# Patient Record
Sex: Female | Born: 1950 | Race: White | Hispanic: No | Marital: Married | Smoking: Never smoker
Health system: Southern US, Community
[De-identification: ages and names within clinical notes are randomized; demographics above are authoritative.]

## PROBLEM LIST (undated history)

## (undated) DIAGNOSIS — E079 Disorder of thyroid, unspecified: Secondary | ICD-10-CM

## (undated) DIAGNOSIS — I4719 Other supraventricular tachycardia: Secondary | ICD-10-CM

## (undated) DIAGNOSIS — Z9889 Other specified postprocedural states: Secondary | ICD-10-CM

## (undated) DIAGNOSIS — E785 Hyperlipidemia, unspecified: Secondary | ICD-10-CM

## (undated) DIAGNOSIS — M810 Age-related osteoporosis without current pathological fracture: Secondary | ICD-10-CM

## (undated) DIAGNOSIS — I471 Supraventricular tachycardia: Secondary | ICD-10-CM

## (undated) DIAGNOSIS — K219 Gastro-esophageal reflux disease without esophagitis: Secondary | ICD-10-CM

## (undated) DIAGNOSIS — R112 Nausea with vomiting, unspecified: Secondary | ICD-10-CM

## (undated) DIAGNOSIS — N390 Urinary tract infection, site not specified: Secondary | ICD-10-CM

## (undated) DIAGNOSIS — C801 Malignant (primary) neoplasm, unspecified: Secondary | ICD-10-CM

## (undated) HISTORY — PX: BREAST SURGERY: SHX581

## (undated) HISTORY — PX: LAPAROSCOPIC ASSISTED VAGINAL HYSTERECTOMY: SHX5398

---

## 2010-06-30 ENCOUNTER — Emergency Department (HOSPITAL_BASED_OUTPATIENT_CLINIC_OR_DEPARTMENT_OTHER): Admission: EM | Admit: 2010-06-30 | Discharge: 2010-06-30 | Payer: Self-pay | Admitting: Emergency Medicine

## 2010-09-19 DIAGNOSIS — C801 Malignant (primary) neoplasm, unspecified: Secondary | ICD-10-CM

## 2010-09-19 HISTORY — DX: Malignant (primary) neoplasm, unspecified: C80.1

## 2011-04-20 HISTORY — PX: BREAST SURGERY: SHX581

## 2011-10-24 ENCOUNTER — Emergency Department (HOSPITAL_BASED_OUTPATIENT_CLINIC_OR_DEPARTMENT_OTHER)
Admission: EM | Admit: 2011-10-24 | Discharge: 2011-10-24 | Disposition: A | Discharge status: Home / Self Care | Payer: BC Managed Care – PPO | Attending: Emergency Medicine | Admitting: Emergency Medicine

## 2011-10-24 ENCOUNTER — Encounter (HOSPITAL_BASED_OUTPATIENT_CLINIC_OR_DEPARTMENT_OTHER): Payer: Self-pay | Admitting: *Deleted

## 2011-10-24 DIAGNOSIS — T7840XA Allergy, unspecified, initial encounter: Secondary | ICD-10-CM

## 2011-10-24 DIAGNOSIS — L272 Dermatitis due to ingested food: Secondary | ICD-10-CM | POA: Insufficient documentation

## 2011-10-24 DIAGNOSIS — R0602 Shortness of breath: Secondary | ICD-10-CM | POA: Insufficient documentation

## 2011-10-24 DIAGNOSIS — E079 Disorder of thyroid, unspecified: Secondary | ICD-10-CM | POA: Insufficient documentation

## 2011-10-24 DIAGNOSIS — R21 Rash and other nonspecific skin eruption: Secondary | ICD-10-CM | POA: Insufficient documentation

## 2011-10-24 HISTORY — DX: Malignant (primary) neoplasm, unspecified: C80.1

## 2011-10-24 HISTORY — DX: Supraventricular tachycardia: I47.1

## 2011-10-24 HISTORY — DX: Disorder of thyroid, unspecified: E07.9

## 2011-10-24 HISTORY — DX: Other supraventricular tachycardia: I47.19

## 2011-10-24 MED ORDER — EPINEPHRINE 0.3 MG/0.3ML IJ DEVI: 0.3000 mg | INTRAMUSCULAR | Status: DC | PRN

## 2011-10-24 MED ORDER — METHYLPREDNISOLONE SODIUM SUCC 125 MG IJ SOLR
125.0000 mg | Freq: Once | INTRAMUSCULAR | Status: AC
  Administered 2011-10-24: 125 mg via INTRAVENOUS
  Filled 2011-10-24: qty 2

## 2011-10-24 MED ORDER — DIPHENHYDRAMINE HCL 50 MG/ML IJ SOLN
50.0000 mg | Freq: Once | INTRAMUSCULAR | Status: AC
  Administered 2011-10-24: 50 mg via INTRAVENOUS
  Filled 2011-10-24: qty 1

## 2011-10-24 MED ORDER — DIPHENHYDRAMINE HCL 25 MG PO TABS: 25.0000 mg | ORAL_TABLET | Freq: Four times a day (QID) | ORAL | Status: DC

## 2011-10-24 MED ORDER — PREDNISONE 10 MG PO TABS: 40.0000 mg | ORAL_TABLET | Freq: Every day | ORAL | Status: DC

## 2011-10-24 MED ORDER — FAMOTIDINE IN NACL 20-0.9 MG/50ML-% IV SOLN
20.0000 mg | Freq: Once | INTRAVENOUS | Status: AC
  Administered 2011-10-24: 20 mg via INTRAVENOUS
  Filled 2011-10-24: qty 50

## 2011-10-24 MED ORDER — FAMOTIDINE 20 MG PO TABS: 20.0000 mg | ORAL_TABLET | Freq: Two times a day (BID) | ORAL | Status: DC

## 2011-10-24 NOTE — ED Notes (Signed)
Redness and splotchy skin no longer present, denies itching, skin returned to normal coloration.

## 2011-10-24 NOTE — ED Provider Notes (Signed)
History     CSN: [id number]  Arrival date & time 10/24/11  1422   First MD Initiated Contact with Patient 10/24/11 1446      Chief Complaint  Patient presents with  . Allergic Reaction    (Consider location/radiation/quality/duration/timing/severity/associated sxs/prior treatment) Patient is a 61 y.o. female presenting with allergic reaction. The history is provided by the patient.  Allergic Reaction The primary symptoms are  shortness of breath and rash. The primary symptoms do not include wheezing, cough, abdominal pain, nausea, dizziness, altered mental status or angioedema. Primary symptoms comment: positive heart racing The current episode started less than 1 hour ago. The problem has been gradually worsening. This is a new problem.  The rash is not associated with itching.  The onset of the reaction was associated with eating. Significant symptoms also include eye redness and flushing. Significant symptoms that are not present include rhinorrhea or itching.  Pt had lunch including a trio of fish (calamari, salmon, tuna). Symptoms began approx 15 minutes after eating.  Hx PAT, reports it feels nothing like this.  Hx BrCa, bilateral mastectomy in August of last year with no chemo currently- only taking tamoxifen. No new medications.  Past Medical History  Diagnosis Date  . PAT (paroxysmal atrial tachycardia)   . Thyroid disease   . Cancer     Past Surgical History  Procedure Date  . Breast surgery     No family history on file.  History  Substance Use Topics  . Smoking status: Never Smoker   . Smokeless tobacco: Not on file  . Alcohol Use: Yes     Review of Systems  Constitutional: Negative for fever and chills.  HENT: Negative for facial swelling, rhinorrhea, trouble swallowing, neck pain, neck stiffness and voice change.   Eyes: Positive for redness. Negative for visual disturbance.  Respiratory: Positive for shortness of breath. Negative for cough, chest  tightness and wheezing.   Cardiovascular: Negative for chest pain.  Gastrointestinal: Negative for nausea and abdominal pain.  Skin: Positive for flushing and rash. Negative for itching.  Neurological: Negative for dizziness, syncope, speech difficulty and weakness.  Psychiatric/Behavioral: Negative for confusion and altered mental status.  All other systems reviewed and are negative.    Allergies  Review of patient's allergies indicates no known allergies.  Home Medications   Current Outpatient Rx  Name Route Sig Dispense Refill  . SYNTHROID PO Oral Take by mouth.    . TAMOXIFEN CITRATE PO Oral Take by mouth.      BP 139/63  Pulse 107  Temp(Src) 97.8 F (36.6 C) (Oral)  Resp 26  SpO2 100%  Physical Exam  Nursing note and vitals reviewed. Constitutional: She is oriented to person, place, and time. She appears well-developed and well-nourished. She appears distressed.  HENT:  Head: Normocephalic and atraumatic.  Right Ear: External ear normal.  Left Ear: External ear normal.  Nose: Nose normal.  Mouth/Throat: Uvula is midline, oropharynx is clear and moist and mucous membranes are normal. No uvula swelling.  Eyes: Pupils are equal, round, and reactive to light. Right conjunctiva is injected. Left conjunctiva is injected.  Neck: Normal range of motion. Neck supple. No JVD present.  Cardiovascular: Regular rhythm, normal heart sounds and normal pulses.  Tachycardia present.   Pulmonary/Chest: Effort normal and breath sounds normal. No respiratory distress. She has no wheezes. She exhibits no tenderness.  Abdominal: Soft. Bowel sounds are normal. She exhibits no distension. There is no tenderness.  Musculoskeletal: She exhibits no edema  and no tenderness.  Neurological: She is alert and oriented to person, place, and time. No cranial nerve deficit.  Skin: Skin is warm and dry. Rash noted.       Raised, erythematous rash to entire face, upper chest (stops at superior border of  breasts) and bilateral shoulders/upper arms  Psychiatric:       Anxious    ED Course  Procedures (including critical care time)  Labs Reviewed - No data to display No results found.   Dx 1: Allergic reaction to food   MDM  3:00 Pt has been seen and evaluated. Initial H&P completed. Allergic reaction, likely to seafood. Airway intact, no wheezes, no throat swelling or tightness. No epi necessary. Solu-medrol, benadryl, pepcid ordered.   4:00 Pt with complete resolution of symptoms after medications x1. Rash resolved, conjunctival injection resolved, tachycardia improved with rate hovering around 90bpm, denies sensation of dyspnea. Will continue to monitor with disposition to be set by [facility], with whom this patient has been discussed.        Shaaron Adler, [place] 10/24/11 1616

## 2011-10-24 NOTE — ED Provider Notes (Signed)
Medical screening examination/treatment/procedure(s) were conducted as a shared visit with non-physician practitioner(s) and myself.  I personally evaluated the patient during the encounter  Hives head/neck/chest on arrival. Airway intact. Pepcid, benadryl, steroids given. Patient appears to be improved on repeat exam. Monitor, home with same as above. Pt signed out to Dr. Oletta Lamas, Langston Masker, PA  Forbes Cellar, MD 10/24/11 [phone]

## 2011-10-24 NOTE — ED Notes (Signed)
Ate seafood and afterward her skin got red, her heart is beating fast,

## 2013-02-23 DIAGNOSIS — M19049 Primary osteoarthritis, unspecified hand: Secondary | ICD-10-CM | POA: Insufficient documentation

## 2013-05-23 ENCOUNTER — Emergency Department (HOSPITAL_BASED_OUTPATIENT_CLINIC_OR_DEPARTMENT_OTHER): Payer: BC Managed Care – PPO

## 2013-05-23 ENCOUNTER — Emergency Department (HOSPITAL_BASED_OUTPATIENT_CLINIC_OR_DEPARTMENT_OTHER)
Admission: EM | Admit: 2013-05-23 | Discharge: 2013-05-23 | Disposition: A | Discharge status: Home / Self Care | Payer: BC Managed Care – PPO | Attending: Emergency Medicine | Admitting: Emergency Medicine

## 2013-05-23 ENCOUNTER — Encounter (HOSPITAL_BASED_OUTPATIENT_CLINIC_OR_DEPARTMENT_OTHER): Payer: Self-pay | Admitting: Emergency Medicine

## 2013-05-23 DIAGNOSIS — Z8679 Personal history of other diseases of the circulatory system: Secondary | ICD-10-CM | POA: Insufficient documentation

## 2013-05-23 DIAGNOSIS — W010XXA Fall on same level from slipping, tripping and stumbling without subsequent striking against object, initial encounter: Secondary | ICD-10-CM | POA: Insufficient documentation

## 2013-05-23 DIAGNOSIS — M810 Age-related osteoporosis without current pathological fracture: Secondary | ICD-10-CM | POA: Insufficient documentation

## 2013-05-23 DIAGNOSIS — Z859 Personal history of malignant neoplasm, unspecified: Secondary | ICD-10-CM | POA: Insufficient documentation

## 2013-05-23 DIAGNOSIS — S8000XA Contusion of unspecified knee, initial encounter: Secondary | ICD-10-CM | POA: Insufficient documentation

## 2013-05-23 DIAGNOSIS — S8002XA Contusion of left knee, initial encounter: Secondary | ICD-10-CM

## 2013-05-23 DIAGNOSIS — Z79899 Other long term (current) drug therapy: Secondary | ICD-10-CM | POA: Insufficient documentation

## 2013-05-23 DIAGNOSIS — M25462 Effusion, left knee: Secondary | ICD-10-CM

## 2013-05-23 DIAGNOSIS — Y929 Unspecified place or not applicable: Secondary | ICD-10-CM | POA: Insufficient documentation

## 2013-05-23 DIAGNOSIS — Y939 Activity, unspecified: Secondary | ICD-10-CM | POA: Insufficient documentation

## 2013-05-23 DIAGNOSIS — E079 Disorder of thyroid, unspecified: Secondary | ICD-10-CM | POA: Insufficient documentation

## 2013-05-23 HISTORY — DX: Age-related osteoporosis without current pathological fracture: M81.0

## 2013-05-23 MED ORDER — IBUPROFEN 800 MG PO TABS: 800.0000 mg | ORAL_TABLET | Freq: Three times a day (TID) | ORAL | Status: DC

## 2013-05-23 MED ORDER — OXYCODONE-ACETAMINOPHEN 5-325 MG PO TABS: 2.0000 | ORAL_TABLET | ORAL | Status: DC | PRN

## 2013-05-23 NOTE — ED Notes (Signed)
Patient instructed on proper use of crutches, patient understands & provided return demonstration.

## 2013-05-23 NOTE — ED Notes (Signed)
Slipped on wet pavement onto left knee.  Pain and swelling now.  Skin intact.

## 2013-05-23 NOTE — ED Provider Notes (Signed)
CSN: [id number]     Arrival date & time 05/23/13  1548 History   First MD Initiated Contact with Patient 05/23/13 1559     Chief Complaint  Patient presents with  . Knee Injury   (Consider location/radiation/quality/duration/timing/severity/associated sxs/prior Treatment) HPI Comments: Patient presents with left knee pain. She states she slipped on some wet concrete and fell straight down onto the left knee. She has constant throbbing pain with associated swelling to the knee. She denies any other injuries from the fall. She has not taken anything today for the pain.   Past Medical History  Diagnosis Date  . PAT (paroxysmal atrial tachycardia)   . Thyroid disease   . Cancer   . Osteoporosis    Past Surgical History  Procedure Laterality Date  . Breast surgery     No family history on file. History  Substance Use Topics  . Smoking status: Never Smoker   . Smokeless tobacco: Not on file  . Alcohol Use: Yes   OB History   Grav Para Term Preterm Abortions TAB SAB Ect Mult Living                 Review of Systems  Constitutional: Negative for fever.  HENT: Negative for neck pain.   Gastrointestinal: Negative for nausea and vomiting.  Musculoskeletal: Positive for joint swelling and arthralgias. Negative for back pain.  Skin: Negative for wound.  Neurological: Negative for weakness, numbness and headaches.    Allergies  Review of patient's allergies indicates no known allergies.  Home Medications   Current Outpatient Rx  Name  Route  Sig  Dispense  Refill  . denosumab (PROLIA) 60 MG/ML SOLN injection   Subcutaneous   Inject 60 mg into the skin every 6 (six) months. Administer in upper arm, thigh, or abdomen         . exemestane (AROMASIN) 25 MG tablet   Oral   Take 25 mg by mouth daily after breakfast.         . pantoprazole (PROTONIX) 40 MG tablet   Oral   Take 40 mg by mouth daily.         Marland Kitchen EXPIRED: diphenhydrAMINE (BENADRYL) 25 MG tablet   Oral  Take 1 tablet (25 mg total) by mouth every 6 (six) hours.   20 tablet   0   . EPINEPHrine (EPIPEN) 0.3 mg/0.3 mL DEVI   Intramuscular   Inject 0.3 mLs (0.3 mg total) into the muscle as needed.   1 Device   0   . EXPIRED: famotidine (PEPCID) 20 MG tablet   Oral   Take 1 tablet (20 mg total) by mouth 2 (two) times daily.   10 tablet   0   . ibuprofen (ADVIL,MOTRIN) 800 MG tablet   Oral   Take 1 tablet (800 mg total) by mouth 3 (three) times daily.   21 tablet   0   . Levothyroxine Sodium (SYNTHROID PO)   Oral   Take by mouth.         . oxyCODONE-acetaminophen (PERCOCET) 5-325 MG per tablet   Oral   Take 2 tablets by mouth every 4 (four) hours as needed for pain.   20 tablet   0   . predniSONE (DELTASONE) 10 MG tablet   Oral   Take 4 tablets (40 mg total) by mouth daily.   16 tablet   0   . TAMOXIFEN CITRATE PO   Oral   Take by mouth.  BP 142/79  Pulse 82  Temp(Src) 99.1 F (37.3 C) (Oral)  Resp 16  Ht 5\' 7"  (1.702 m)  Wt 130 lb (58.968 kg)  BMI 20.36 kg/m2  SpO2 99% Physical Exam  Constitutional: She is oriented to person, place, and time. She appears well-developed and well-nourished.  HENT:  Head: Normocephalic and atraumatic.  Neck: Normal range of motion. Neck supple.  Cardiovascular: Normal rate.   Pulmonary/Chest: Effort normal.  Musculoskeletal: She exhibits edema and tenderness.  +TTP anterior left knee.  +effusion/swelling noted.  No pain to ankle/knee.  Ligament exam inadequate due to pt discomfort, but no gross instability. Pt is able to due SLR.  NV intact distally.  No overlying wounds  Neurological: She is alert and oriented to person, place, and time.  Skin: Skin is warm and dry.  Psychiatric: She has a normal mood and affect.    ED Course  Procedures (including critical care time) Labs Review Labs Reviewed - No data to display Imaging Review Dg Knee Complete 4 Views Left  05/23/2013   *RADIOLOGY REPORT*  Clinical Data:  Left knee injury  LEFT KNEE - COMPLETE 4+ VIEW  Comparison: None.  Findings: Four views of the right knee demonstrate a moderate suprapatellar joint effusion.  Mild soft tissue swelling in the prepatellar space.  No acute fracture or malalignment is identified.  Bony mineralization is within normal limits.  No significant degenerative changes.  IMPRESSION: Prepatellar soft tissue swelling and moderate suprapatellar knee joint effusion without evidence of acute fracture or malalignment.   Original Report Authenticated By: Malachy Moan, M.D.    MDM   1. Knee contusion, left, initial encounter   2. Knee effusion, left    Pt was advised in RICE.  Was placed in a knee immobilizer and given crutches. She was given prescription for ibuprofen 800 mg tablets and Percocet. She was given a referral to followup with Dr. Pearletha Forge.    Rolan Bucco, MD 05/23/13 [phone]

## 2014-04-21 DIAGNOSIS — E039 Hypothyroidism, unspecified: Secondary | ICD-10-CM | POA: Insufficient documentation

## 2014-04-21 DIAGNOSIS — Z170 Estrogen receptor positive status [ER+]: Secondary | ICD-10-CM | POA: Insufficient documentation

## 2014-04-21 DIAGNOSIS — M810 Age-related osteoporosis without current pathological fracture: Secondary | ICD-10-CM | POA: Insufficient documentation

## 2014-04-21 DIAGNOSIS — C50412 Malignant neoplasm of upper-outer quadrant of left female breast: Secondary | ICD-10-CM | POA: Insufficient documentation

## 2014-04-21 DIAGNOSIS — K219 Gastro-esophageal reflux disease without esophagitis: Secondary | ICD-10-CM | POA: Insufficient documentation

## 2014-04-21 HISTORY — DX: Hypothyroidism, unspecified: E03.9

## 2014-06-27 DIAGNOSIS — N3020 Other chronic cystitis without hematuria: Secondary | ICD-10-CM | POA: Insufficient documentation

## 2014-06-27 DIAGNOSIS — H93299 Other abnormal auditory perceptions, unspecified ear: Secondary | ICD-10-CM | POA: Insufficient documentation

## 2014-06-27 DIAGNOSIS — E785 Hyperlipidemia, unspecified: Secondary | ICD-10-CM | POA: Insufficient documentation

## 2014-06-27 DIAGNOSIS — E559 Vitamin D deficiency, unspecified: Secondary | ICD-10-CM | POA: Insufficient documentation

## 2014-06-27 DIAGNOSIS — N952 Postmenopausal atrophic vaginitis: Secondary | ICD-10-CM | POA: Insufficient documentation

## 2014-06-27 DIAGNOSIS — I491 Atrial premature depolarization: Secondary | ICD-10-CM | POA: Insufficient documentation

## 2015-06-09 DIAGNOSIS — Z08 Encounter for follow-up examination after completed treatment for malignant neoplasm: Secondary | ICD-10-CM | POA: Insufficient documentation

## 2015-06-09 DIAGNOSIS — Z853 Personal history of malignant neoplasm of breast: Secondary | ICD-10-CM | POA: Insufficient documentation

## 2015-08-18 DIAGNOSIS — R002 Palpitations: Secondary | ICD-10-CM | POA: Insufficient documentation

## 2015-12-16 DIAGNOSIS — Z79811 Long term (current) use of aromatase inhibitors: Secondary | ICD-10-CM | POA: Insufficient documentation

## 2015-12-16 DIAGNOSIS — Z5181 Encounter for therapeutic drug level monitoring: Secondary | ICD-10-CM | POA: Insufficient documentation

## 2017-08-31 DIAGNOSIS — N390 Urinary tract infection, site not specified: Secondary | ICD-10-CM | POA: Insufficient documentation

## 2017-10-03 DIAGNOSIS — H6123 Impacted cerumen, bilateral: Secondary | ICD-10-CM | POA: Insufficient documentation

## 2017-11-02 DIAGNOSIS — Z5181 Encounter for therapeutic drug level monitoring: Secondary | ICD-10-CM | POA: Insufficient documentation

## 2017-11-02 DIAGNOSIS — Z79899 Other long term (current) drug therapy: Secondary | ICD-10-CM | POA: Insufficient documentation

## 2019-10-27 ENCOUNTER — Ambulatory Visit: Payer: Medicare Other | Attending: Internal Medicine

## 2019-10-27 DIAGNOSIS — Z23 Encounter for immunization: Secondary | ICD-10-CM

## 2019-10-27 NOTE — Progress Notes (Signed)
   Covid-19 Vaccination Clinic  Name:  Robin York    MRN: [id number] DOB: 11/25/1950  10/27/2019  [name] was observed post Covid-19 immunization for 15 minutes without incidence. She was provided with Vaccine Information Sheet and instruction to access the V-Safe system.   [name] was instructed to call 911 with any severe reactions post vaccine: Marland Kitchen Difficulty breathing  . Swelling of your face and throat  . A fast heartbeat  . A bad rash all over your body  . Dizziness and weakness    Immunizations Administered    Name Date Dose VIS Date Route   Pfizer COVID-19 Vaccine 10/27/2019  2:51 PM 0.3 mL 08/30/2019 Intramuscular   Manufacturer: [place]   Lot: [id number]   [place]: [id number]

## 2019-11-11 ENCOUNTER — Ambulatory Visit: Payer: Self-pay

## 2019-11-21 ENCOUNTER — Ambulatory Visit: Payer: Medicare Other | Attending: Internal Medicine

## 2019-11-21 DIAGNOSIS — Z23 Encounter for immunization: Secondary | ICD-10-CM | POA: Insufficient documentation

## 2019-11-21 NOTE — Progress Notes (Signed)
   Covid-19 Vaccination Clinic  Name:  Aris Strelow    MRN: [id number] DOB: [birth year]  11/21/2019  Ms. Mcenery was observed post Covid-19 immunization for 15 minutes without incident. She was provided with Vaccine Information Sheet and instruction to access the V-Safe system.   Ms. Urzua was instructed to call 911 with any severe reactions post vaccine: Marland Kitchen Difficulty breathing  . Swelling of face and throat  . A fast heartbeat  . A bad rash all over body  . Dizziness and weakness   Immunizations Administered    Name Date Dose VIS Date Route   Pfizer COVID-19 Vaccine 11/21/2019  9:54 AM 0.3 mL 08/30/2019 Intramuscular   Manufacturer: [employer]   Lot: [id number]   [place]: [id number]

## 2020-07-20 HISTORY — PX: FINGER SURGERY: SHX640

## 2020-07-20 HISTORY — PX: COLONOSCOPY: SHX174

## 2021-03-18 DIAGNOSIS — R419 Unspecified symptoms and signs involving cognitive functions and awareness: Secondary | ICD-10-CM | POA: Insufficient documentation

## 2021-03-25 ENCOUNTER — Encounter: Payer: Self-pay | Admitting: Podiatry

## 2021-03-25 ENCOUNTER — Ambulatory Visit (INDEPENDENT_AMBULATORY_CARE_PROVIDER_SITE_OTHER): Payer: Medicare Other

## 2021-03-25 ENCOUNTER — Other Ambulatory Visit: Payer: Self-pay

## 2021-03-25 ENCOUNTER — Ambulatory Visit: Payer: Medicare Other | Admitting: Podiatry

## 2021-03-25 DIAGNOSIS — M2011 Hallux valgus (acquired), right foot: Secondary | ICD-10-CM

## 2021-03-25 DIAGNOSIS — M2010 Hallux valgus (acquired), unspecified foot: Secondary | ICD-10-CM

## 2021-03-25 DIAGNOSIS — Q66219 Congenital metatarsus primus varus, unspecified foot: Secondary | ICD-10-CM

## 2021-03-25 DIAGNOSIS — M2042 Other hammer toe(s) (acquired), left foot: Secondary | ICD-10-CM

## 2021-03-25 DIAGNOSIS — M2012 Hallux valgus (acquired), left foot: Secondary | ICD-10-CM

## 2021-03-25 DIAGNOSIS — D2371 Other benign neoplasm of skin of right lower limb, including hip: Secondary | ICD-10-CM

## 2021-03-25 DIAGNOSIS — M2041 Other hammer toe(s) (acquired), right foot: Secondary | ICD-10-CM | POA: Diagnosis not present

## 2021-03-28 NOTE — Progress Notes (Signed)
  Subjective:  Patient ID: Robin York, female    DOB: June 16, 1951,  MRN: [id number]  Chief Complaint  Patient presents with   Foot Pain    1st MPJ bilateral and 2nd toe right - bunion and hammertoe deformities x years, aching more, rubbing other toes and shoes causing calluses/corns, using bandaids to cushion, very active lifestyle    New Patient (Initial Visit)    70 y.o. female presents with the above complaint. History confirmed with patient.  She is referred to me by Dr. Milinda Pointer for surgical consultation.  She is previously seen someone for evaluation for bunion surgery but was told it could be a very difficult recovery and should wait until its very painful for her.  Been getting progressively worse.  She does have a history of vitamin D deficiency and osteoporosis and is on supplementation for this.  Objective:  Physical Exam: warm, good capillary refill, no trophic changes or ulcerative lesions, normal DP and PT pulses, and normal sensory exam.  Bilateral she has hallux valgus and second hammertoe deformity, the right foot is worse, negative grind test.  Good range of motion.  Valgus rotation of the first ray bilaterally.  She has callus formation on the medial second toe   Radiographs: Multiple views x-ray of both feet: Bilateral hallux valgus deformity with met primus varus and second hammertoe deformity Assessment:   1. Acquired hallux valgus, unspecified laterality   2. Hammer toes of both feet   3. Benign neoplasm of skin of right foot   4. Metatarsus primus varus      Plan:  Patient was evaluated and treated and all questions answered.  Discussed the etiology and treatment including surgical and non surgical treatment for painful bunions and hammertoes.  She has exhausted all non surgical treatment prior to this visit including shoe gear changes and padding.  She desires surgical intervention. We discussed all risks including but not limited to: pain, swelling, infection,  scar, numbness which may be temporary or permanent, chronic pain, stiffness, nerve pain or damage, wound healing problems, bone healing problems including delayed or non-union and recurrence. Specifically we discussed the following procedures: Lapidus bunionectomy with autograft from ipsilateral calcaneus and second hammertoe correction.  She would like to plan for this in the fall.  She will return to see me in September for presurgical planning visit.  All questions were addressed.  No follow-ups on file.

## 2021-05-20 ENCOUNTER — Other Ambulatory Visit: Payer: Self-pay

## 2021-05-20 ENCOUNTER — Ambulatory Visit (INDEPENDENT_AMBULATORY_CARE_PROVIDER_SITE_OTHER): Payer: Medicare Other

## 2021-05-20 ENCOUNTER — Ambulatory Visit: Payer: Medicare Other | Admitting: Podiatry

## 2021-05-20 DIAGNOSIS — M21611 Bunion of right foot: Secondary | ICD-10-CM

## 2021-05-20 DIAGNOSIS — M21612 Bunion of left foot: Secondary | ICD-10-CM

## 2021-05-25 NOTE — Progress Notes (Signed)
  Subjective:  Patient ID: Robin York, female    DOB: Aug 12, 1951,  MRN: [id number]  Chief Complaint  Patient presents with   Bunions    2 month follow up    70 y.o. female presents with the above complaint. History confirmed with patient.  She is here for follow-up on bilateral bunions she is here with her husband today  Objective:  Physical Exam: warm, good capillary refill, no trophic changes or ulcerative lesions, normal DP and PT pulses, and normal sensory exam.  Bilateral she has hallux valgus and second hammertoe deformity, the right foot is worse, negative grind test.  Good range of motion.  Valgus rotation of the first ray bilaterally.  She has callus formation on the medial second toe   Radiographs: Multiple views x-ray of both feet: Bilateral hallux valgus deformity with met primus varus and second hammertoe deformity, sesamoid axial views taken today show subluxation of the complex as well Assessment:   1. Bilateral bunions      Plan:  Patient was evaluated and treated and all questions answered.  We again reviewed the radiographs in detail discussed the procedure itself and the postoperative recovery process.  We will plan for surgery on November 11.  She will return prior to surgery for her preoperative planning visit and will sign informed consent and dispense her surgical boot at that time.  Plan will be for Lapiplasty with autograft from the heel and possible Akin if necessary with possible Weil osteotomy and hammertoe correction of the second toe, right foot will be first  Return in about 8 weeks (around 07/15/2021) for pre surgery planning visit R foot for bunion and hammertoe.

## 2021-07-15 ENCOUNTER — Emergency Department (HOSPITAL_BASED_OUTPATIENT_CLINIC_OR_DEPARTMENT_OTHER): Payer: Medicare Other

## 2021-07-15 ENCOUNTER — Ambulatory Visit: Payer: Medicare Other | Admitting: Podiatry

## 2021-07-15 ENCOUNTER — Encounter (HOSPITAL_BASED_OUTPATIENT_CLINIC_OR_DEPARTMENT_OTHER): Payer: Self-pay | Admitting: *Deleted

## 2021-07-15 ENCOUNTER — Emergency Department (HOSPITAL_BASED_OUTPATIENT_CLINIC_OR_DEPARTMENT_OTHER)
Admission: EM | Admit: 2021-07-15 | Discharge: 2021-07-15 | Disposition: A | Discharge status: Home / Self Care | Payer: Medicare Other | Attending: Emergency Medicine | Admitting: Emergency Medicine

## 2021-07-15 ENCOUNTER — Other Ambulatory Visit: Payer: Self-pay

## 2021-07-15 DIAGNOSIS — S62109A Fracture of unspecified carpal bone, unspecified wrist, initial encounter for closed fracture: Secondary | ICD-10-CM

## 2021-07-15 DIAGNOSIS — W010XXA Fall on same level from slipping, tripping and stumbling without subsequent striking against object, initial encounter: Secondary | ICD-10-CM | POA: Diagnosis not present

## 2021-07-15 DIAGNOSIS — Y92002 Bathroom of unspecified non-institutional (private) residence single-family (private) house as the place of occurrence of the external cause: Secondary | ICD-10-CM | POA: Diagnosis not present

## 2021-07-15 DIAGNOSIS — E039 Hypothyroidism, unspecified: Secondary | ICD-10-CM | POA: Insufficient documentation

## 2021-07-15 DIAGNOSIS — S52502A Unspecified fracture of the lower end of left radius, initial encounter for closed fracture: Secondary | ICD-10-CM | POA: Diagnosis not present

## 2021-07-15 DIAGNOSIS — S6992XA Unspecified injury of left wrist, hand and finger(s), initial encounter: Secondary | ICD-10-CM | POA: Diagnosis present

## 2021-07-15 DIAGNOSIS — Z853 Personal history of malignant neoplasm of breast: Secondary | ICD-10-CM | POA: Diagnosis not present

## 2021-07-15 DIAGNOSIS — S52572A Other intraarticular fracture of lower end of left radius, initial encounter for closed fracture: Secondary | ICD-10-CM

## 2021-07-15 MED ORDER — MORPHINE SULFATE (PF) 4 MG/ML IV SOLN
4.0000 mg | Freq: Once | INTRAVENOUS | Status: AC
  Administered 2021-07-15: 4 mg via INTRAVENOUS
  Filled 2021-07-15: qty 1

## 2021-07-15 MED ORDER — ONDANSETRON HCL 4 MG PO TABS: 4.0000 mg | ORAL_TABLET | Freq: Three times a day (TID) | ORAL | 0 refills | Status: DC | PRN

## 2021-07-15 MED ORDER — ONDANSETRON HCL 4 MG/2ML IJ SOLN
4.0000 mg | Freq: Once | INTRAMUSCULAR | Status: AC
  Administered 2021-07-15: 4 mg via INTRAVENOUS
  Filled 2021-07-15: qty 2

## 2021-07-15 MED ORDER — OXYCODONE-ACETAMINOPHEN 5-325 MG PO TABS: 1.0000 | ORAL_TABLET | Freq: Four times a day (QID) | ORAL | 0 refills | Status: DC | PRN

## 2021-07-15 NOTE — ED Triage Notes (Signed)
She slipped and fell in her bathroom injury to her left wrist with deformity. To ER via EMS. She was given Fentanyl 50 mcg IV.

## 2021-07-15 NOTE — Discharge Instructions (Signed)
You have a distal radius fracture.  Please keep splint in place and please call hand surgeon office tomorrow.  He plans to see you tomorrow or Monday  Please keep ice on it.  Take Tylenol for pain and take Percocet for severe pain  Return to ER if you have worse wrist pain, fingers turning blue,

## 2021-07-15 NOTE — ED Notes (Addendum)
ED Provider at bedside.  MD applied traction to facilitate alignment of fracture

## 2021-07-15 NOTE — ED Provider Notes (Signed)
[place] EMERGENCY DEPARTMENT Provider Note   CSN: [id number] Arrival date & time: 07/15/21  1737     History Chief Complaint  Patient presents with   Fall   Wrist Injury    Robin York is a 70 y.o. female hx of UTI, here with fall and left wrist pain. Patient states that she slipped and fell and landed on her left wrist. Denies other injuries. Patient is not on a blood thinner. No meds prior to arrival.   The history is provided by the patient.      Past Medical History:  Diagnosis Date   Cancer [facility])    Osteoporosis    PAT (paroxysmal atrial tachycardia) ([place])    Thyroid disease     Patient Active Problem List   Diagnosis Date Noted   Cognitive complaints with normal neuropsychological exam 03/18/2021   Encounter for monitoring denosumab therapy 11/02/2017   Bilateral impacted cerumen 10/03/2017   Recurrent UTI 08/31/2017   Encounter for monitoring aromatase inhibitor therapy 12/16/2015   Palpitation 08/18/2015   Encounter for follow-up surveillance of breast cancer 06/09/2015   Abnormal auditory perception 06/27/2014   Atrial premature depolarization 06/27/2014   Atrophic vaginitis 06/27/2014   Chronic cystitis 06/27/2014   Hyperlipemia 06/27/2014   Vitamin D deficiency 06/27/2014   Age-related osteoporosis without current pathological fracture 04/21/2014   GERD (gastroesophageal reflux disease) 04/21/2014   Hypothyroidism 04/21/2014   Malignant neoplasm of upper-outer quadrant of left breast in female, estrogen receptor positive (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     No family history on file.  Social History   Tobacco Use   Smoking status: Never   Smokeless tobacco: Never  Vaping Use   Vaping Use: Never used  Substance Use Topics   Alcohol use:  Yes   Drug use: No    Home Medications Prior to Admission medications   Medication Sig Start Date End Date Taking? Authorizing Provider  cephALEXin (KEFLEX) 500 MG capsule Take 500 mg by mouth daily. For preventative treatment   Yes [provider]  rosuvastatin (CRESTOR) 5 MG tablet Take 5 mg by mouth daily. 03/17/21  Yes [provider]    Allergies    Nitrofurantoin  Review of Systems   Review of Systems  Musculoskeletal:        L wrist pain   All other systems reviewed and are negative.  Physical Exam Updated Vital Signs BP (!) 146/80 (BP Location: Right Arm)   Pulse 86   Temp 98.4 F (36.9 C) (Oral)   Resp 18   Ht 5\' 7"  (1.702 m)   Wt 56.7 kg   SpO2 99%   BMI 19.58 kg/m   Physical Exam Vitals and nursing note reviewed.  HENT:     Head: Normocephalic and atraumatic.     Nose: Nose normal.     Mouth/Throat:     Mouth: Mucous membranes are moist.  Eyes:     Extraocular Movements: Extraocular movements intact.     Conjunctiva/sclera: Conjunctivae normal.     Pupils: Pupils are equal, round, and reactive to light.  Cardiovascular:     Rate and Rhythm: Normal rate and regular rhythm.     Pulses: Normal pulses.  Pulmonary:     Effort: Pulmonary effort is normal.  Breath sounds: Normal breath sounds.  Abdominal:     General: Abdomen is flat.     Palpations: Abdomen is soft.  Musculoskeletal:        General: Normal range of motion.     Cervical back: Normal range of motion and neck supple.     Comments: + L wrist with obvious deformity. Unable to range the wrist. 2+ radial pulses, able to hand grasp   Skin:    General: Skin is warm.     Capillary Refill: Capillary refill takes less than 2 seconds.  Neurological:     General: No focal deficit present.     Mental Status: She is oriented to person, place, and time.  Psychiatric:        Mood and Affect: Mood normal.        Behavior: Behavior normal.    ED Results / Procedures /  Treatments   Labs (all labs ordered are listed, but only abnormal results are displayed) Labs Reviewed - No data to display  EKG None  Radiology DG Wrist Complete Left  Result Date: 07/15/2021 CLINICAL DATA:  Fall, wrist injury EXAM: LEFT WRIST - COMPLETE 3+ VIEW COMPARISON:  None. FINDINGS: Comminuted impacted fracture in the distal left radius with intra-articular extension and mild posterior angulation/displacement. No subluxation or dislocation. Probable small avulsion off the ulnar styloid. IMPRESSION: Impacted, comminuted intra-articular distal left radial fracture with posterior angulation and displacement. Ulnar styloid avulsion. Electronically Signed   By: Rolm Baptise M.D.   On: 07/15/2021 18:32    Procedures Reduction of fracture  Date/Time: 07/15/2021 9:03 PM Performed by: Drenda Freeze, MD Authorized by: Drenda Freeze, MD  Consent: Verbal consent obtained. Risks and benefits: risks, benefits and alternatives were discussed Consent given by: patient Patient understanding: patient states understanding of the procedure being performed Patient consent: the patient's understanding of the procedure matches consent given Patient identity confirmed: verbally with patient Local anesthesia used: no  Anesthesia: Local anesthesia used: no  Sedation: Patient sedated: no  Patient tolerance: patient tolerated the procedure well with no immediate complications Comments: Placed patient on finger trap and used weights. Then performed reduction and I applied splint    SPLINT APPLICATION Date/Time: 0:10 PM Authorized by: Wandra Arthurs Consent: Verbal consent obtained. Risks and benefits: risks, benefits and alternatives were discussed Consent given by: patient Splint applied by: myself Location details: L wrist Splint type: sugar tongue  Supplies used: fiberglass Post-procedure: The splinted body part was neurovascularly unchanged following the procedure. Patient  tolerance: Patient tolerated the procedure well with no immediate complications.       Medications Ordered in ED Medications  morphine 4 MG/ML injection 4 mg (has no administration in time range)    ED Course  I have reviewed the triage vital signs and the nursing notes.  Pertinent labs & imaging results that were available during my care of the patient were reviewed by me and considered in my medical decision making (see chart for details).    MDM Rules/Calculators/A&P                           Robin York is a 70 y.o. female here with L wrist injury. Has obvious deformity. Will get xrays.   9:02 PM X-ray showed impacted left distal radius fracture.  I talked to Dr. Tempie Donning from hand surgery.  He recommend reduction at bedside and he will see patient tomorrow.  I was able to  reduce it after putting her in a fingertrap using weights.  Postreduction x-rays showed improved alignment.  Will discharge patient with pain medicine and will have her call hand surgery tomorrow for follow-up  Final Clinical Impression(s) / ED Diagnoses Final diagnoses:  None    Rx / DC Orders ED Discharge Orders     None        Drenda Freeze, MD 07/15/21 2105

## 2021-07-16 ENCOUNTER — Encounter: Payer: Self-pay | Admitting: Orthopedic Surgery

## 2021-07-16 ENCOUNTER — Ambulatory Visit (INDEPENDENT_AMBULATORY_CARE_PROVIDER_SITE_OTHER): Payer: Medicare Other | Admitting: Orthopedic Surgery

## 2021-07-16 DIAGNOSIS — S52502A Unspecified fracture of the lower end of left radius, initial encounter for closed fracture: Secondary | ICD-10-CM | POA: Insufficient documentation

## 2021-07-16 DIAGNOSIS — S52572A Other intraarticular fracture of lower end of left radius, initial encounter for closed fracture: Secondary | ICD-10-CM

## 2021-07-16 MED ORDER — TRAMADOL HCL 50 MG PO TABS: 50.0000 mg | ORAL_TABLET | Freq: Four times a day (QID) | ORAL | 0 refills | Status: DC | PRN

## 2021-07-16 NOTE — H&P (View-Only) (Signed)
Office Visit Note   Patient: Robin York           Date of Birth: 1951/07/15           MRN: [id number] Visit Date: 07/16/2021              Requested by: Concepcion Elk, MD [address] [place],  [place] [address] PCP: Concepcion Elk, MD   Assessment & Plan: Visit Diagnoses:  1. Other closed intra-articular fracture of distal end of left radius, initial encounter     Plan: We discussed the diagnosis, prognosis, and both conservative and operative treatment options for her left distal radius fracture.  After our discussion, the patient has elected to proceed with open reduction and internal fixation.  We reviewed the benefits of surgery and the potential risks including, but not limited to, persistent symptoms, infection, damage to nearby nerves and blood vessels, delayed wound healing, nonunion, or malunion.    We will replace her splint given the significant swelling of her hand and fingers.   All patient concerns and questions were addressed.  A surgical date will be confirmed with the patient.    Follow-Up Instructions: No follow-ups on file.   Orders:  No orders of the defined types were placed in this encounter.  No orders of the defined types were placed in this encounter.     Procedures: No procedures performed   Clinical Data: No additional findings.   Subjective: Chief Complaint  Patient presents with   Left Wrist - Pain    This is a 70 yo active RHD F who presents as a follow-up from the ER with a closed left distal radius fracture.  She was walking in her bathroom and slipped and fell yesterday.  She was seen in the ER where a closed reduction was performed using traction.  Today she reports that she has increased swelling in her hand and fingers.  She is been unable to tolerate the opioid medication that she was prescribed.  She still has pain in the wrist.  She is very active and plays golf frequently.  She is traveling next weekend for a work-related trip  to [place].   Review of Systems   Objective: Vital Signs: There were no vitals taken for this visit.  Physical Exam Constitutional:      Appearance: Normal appearance.  Cardiovascular:     Rate and Rhythm: Normal rate.     Pulses: Normal pulses.  Pulmonary:     Effort: Pulmonary effort is normal.  Skin:    General: Skin is warm and dry.     Capillary Refill: Capillary refill takes less than 2 seconds.  Neurological:     Mental Status: She is alert.    Left Hand Exam   Other  Sensation: normal Pulse: present  Comments:  Hand and fingers quite swollen secondary to splint tightness.  SILT m/u/r distribution. Able to make complete fist and fully extend fingers.      Specialty Comments:  No specialty comments available.  Imaging: DG Wrist 2 Views Left  Result Date: 07/15/2021 CLINICAL DATA:  Post reduction images. EXAM: LEFT WRIST - 2 VIEW COMPARISON:  July 15, 2021 FINDINGS: The left wrist was imaged in a fiberglass cast with subsequently obscured osseous and soft tissue detail. Acute fracture of the distal left radius is seen with gross anatomic alignment. There is no evidence of dislocation. Degenerative changes seen involving the carpometacarpal articulation of the left thumb. Soft tissue swelling is noted adjacent  to the previously described fracture site. IMPRESSION: Status post reduction of fracture of the distal left radius with gross anatomic alignment. Electronically Signed   By: Virgina Norfolk M.D.   On: 07/15/2021 20:53   DG Wrist Complete Left  Result Date: 07/15/2021 CLINICAL DATA:  Fall, wrist injury EXAM: LEFT WRIST - COMPLETE 3+ VIEW COMPARISON:  None. FINDINGS: Comminuted impacted fracture in the distal left radius with intra-articular extension and mild posterior angulation/displacement. No subluxation or dislocation. Probable small avulsion off the ulnar styloid. IMPRESSION: Impacted, comminuted intra-articular distal left radial fracture with  posterior angulation and displacement. Ulnar styloid avulsion. Electronically Signed   By: Rolm Baptise M.D.   On: 07/15/2021 18:32     PMFS History: Patient Active Problem List   Diagnosis Date Noted   Closed fracture of left distal radius 07/16/2021   Cognitive complaints with normal neuropsychological exam 03/18/2021   Encounter for monitoring denosumab therapy 11/02/2017   Bilateral impacted cerumen 10/03/2017   Recurrent UTI 08/31/2017   Encounter for monitoring aromatase inhibitor therapy 12/16/2015   Palpitation 08/18/2015   Encounter for follow-up surveillance of breast cancer 06/09/2015   Abnormal auditory perception 06/27/2014   Atrial premature depolarization 06/27/2014   Atrophic vaginitis 06/27/2014   Chronic cystitis 06/27/2014   Hyperlipemia 06/27/2014   Vitamin D deficiency 06/27/2014   Age-related osteoporosis without current pathological fracture 04/21/2014   GERD (gastroesophageal reflux disease) 04/21/2014   Hypothyroidism 04/21/2014   Malignant neoplasm of upper-outer quadrant of left breast in female, estrogen receptor positive ([place]) 04/21/2014   Osteoporosis 04/21/2014   Primary localized osteoarthrosis of hand 02/23/2013   Past Medical History:  Diagnosis Date   Cancer ([place])    Osteoporosis    PAT (paroxysmal atrial tachycardia) (HCC)    Thyroid disease     History reviewed. No pertinent family history.  Past Surgical History:  Procedure Laterality Date   BREAST SURGERY     Social History   Occupational History   Not on file  Tobacco Use   Smoking status: Never   Smokeless tobacco: Never  Vaping Use   Vaping Use: Never used  Substance and Sexual Activity   Alcohol use: Yes   Drug use: No   Sexual activity: Yes    Birth control/protection: None

## 2021-07-16 NOTE — Addendum Note (Signed)
Addended by: Sherilyn Cooter on: 07/16/2021 02:22 PM   Modules accepted: Orders

## 2021-07-16 NOTE — Progress Notes (Signed)
Office Visit Note   Patient: Robin York           Date of Birth: 1951/07/12           MRN: [id number] Visit Date: 07/16/2021              Requested by: Concepcion Elk, MD [address] [place],  [place] [address] PCP: Concepcion Elk, MD   Assessment & Plan: Visit Diagnoses:  1. Other closed intra-articular fracture of distal end of left radius, initial encounter     Plan: We discussed the diagnosis, prognosis, and both conservative and operative treatment options for her left distal radius fracture.  After our discussion, the patient has elected to proceed with open reduction and internal fixation.  We reviewed the benefits of surgery and the potential risks including, but not limited to, persistent symptoms, infection, damage to nearby nerves and blood vessels, delayed wound healing, nonunion, or malunion.    We will replace her splint given the significant swelling of her hand and fingers.   All patient concerns and questions were addressed.  A surgical date will be confirmed with the patient.    Follow-Up Instructions: No follow-ups on file.   Orders:  No orders of the defined types were placed in this encounter.  No orders of the defined types were placed in this encounter.     Procedures: No procedures performed   Clinical Data: No additional findings.   Subjective: Chief Complaint  Patient presents with   Left Wrist - Pain    This is a 70 yo active RHD F who presents as a follow-up from the ER with a closed left distal radius fracture.  She was walking in her bathroom and slipped and fell yesterday.  She was seen in the ER where a closed reduction was performed using traction.  Today she reports that she has increased swelling in her hand and fingers.  She is been unable to tolerate the opioid medication that she was prescribed.  She still has pain in the wrist.  She is very active and plays golf frequently.  She is traveling next weekend for a work-related trip  to [place].   Review of Systems   Objective: Vital Signs: There were no vitals taken for this visit.  Physical Exam Constitutional:      Appearance: Normal appearance.  Cardiovascular:     Rate and Rhythm: Normal rate.     Pulses: Normal pulses.  Pulmonary:     Effort: Pulmonary effort is normal.  Skin:    General: Skin is warm and dry.     Capillary Refill: Capillary refill takes less than 2 seconds.  Neurological:     Mental Status: She is alert.    Left Hand Exam   Other  Sensation: normal Pulse: present  Comments:  Hand and fingers quite swollen secondary to splint tightness.  SILT m/u/r distribution. Able to make complete fist and fully extend fingers.      Specialty Comments:  No specialty comments available.  Imaging: DG Wrist 2 Views Left  Result Date: 07/15/2021 CLINICAL DATA:  Post reduction images. EXAM: LEFT WRIST - 2 VIEW COMPARISON:  July 15, 2021 FINDINGS: The left wrist was imaged in a fiberglass cast with subsequently obscured osseous and soft tissue detail. Acute fracture of the distal left radius is seen with gross anatomic alignment. There is no evidence of dislocation. Degenerative changes seen involving the carpometacarpal articulation of the left thumb. Soft tissue swelling is noted adjacent  to the previously described fracture site. IMPRESSION: Status post reduction of fracture of the distal left radius with gross anatomic alignment. Electronically Signed   By: Virgina Norfolk M.D.   On: 07/15/2021 20:53   DG Wrist Complete Left  Result Date: 07/15/2021 CLINICAL DATA:  Fall, wrist injury EXAM: LEFT WRIST - COMPLETE 3+ VIEW COMPARISON:  None. FINDINGS: Comminuted impacted fracture in the distal left radius with intra-articular extension and mild posterior angulation/displacement. No subluxation or dislocation. Probable small avulsion off the ulnar styloid. IMPRESSION: Impacted, comminuted intra-articular distal left radial fracture with  posterior angulation and displacement. Ulnar styloid avulsion. Electronically Signed   By: Rolm Baptise M.D.   On: 07/15/2021 18:32     PMFS History: Patient Active Problem List   Diagnosis Date Noted   Closed fracture of left distal radius 07/16/2021   Cognitive complaints with normal neuropsychological exam 03/18/2021   Encounter for monitoring denosumab therapy 11/02/2017   Bilateral impacted cerumen 10/03/2017   Recurrent UTI 08/31/2017   Encounter for monitoring aromatase inhibitor therapy 12/16/2015   Palpitation 08/18/2015   Encounter for follow-up surveillance of breast cancer 06/09/2015   Abnormal auditory perception 06/27/2014   Atrial premature depolarization 06/27/2014   Atrophic vaginitis 06/27/2014   Chronic cystitis 06/27/2014   Hyperlipemia 06/27/2014   Vitamin D deficiency 06/27/2014   Age-related osteoporosis without current pathological fracture 04/21/2014   GERD (gastroesophageal reflux disease) 04/21/2014   Hypothyroidism 04/21/2014   Malignant neoplasm of upper-outer quadrant of left breast in female, estrogen receptor positive ([place]) 04/21/2014   Osteoporosis 04/21/2014   Primary localized osteoarthrosis of hand 02/23/2013   Past Medical History:  Diagnosis Date   Cancer ([facility])    Osteoporosis    PAT (paroxysmal atrial tachycardia) (HCC)    Thyroid disease     History reviewed. No pertinent family history.  Past Surgical History:  Procedure Laterality Date   BREAST SURGERY     Social History   Occupational History   Not on file  Tobacco Use   Smoking status: Never   Smokeless tobacco: Never  Vaping Use   Vaping Use: Never used  Substance and Sexual Activity   Alcohol use: Yes   Drug use: No   Sexual activity: Yes    Birth control/protection: None

## 2021-07-19 ENCOUNTER — Other Ambulatory Visit: Payer: Self-pay

## 2021-07-19 ENCOUNTER — Encounter (HOSPITAL_COMMUNITY): Payer: Self-pay | Admitting: Orthopedic Surgery

## 2021-07-19 NOTE — Progress Notes (Signed)
DUE TO COVID-19 ONLY ONE VISITOR IS ALLOWED TO COME WITH YOU AND STAY IN THE WAITING ROOM ONLY DURING PRE OP AND PROCEDURE DAY OF SURGERY.   PCP/Internal Med - Dr Concepcion Elk Cardiologist - n/a Endocrinology - Dr Adine Madura - Dr Acquanetta Sit Hematology/Oncology - Dr Jacqulyn Ducking  Chest x-ray - n/a EKG - n/a Stress Test - n/a ECHO - 09/02/15 [facility] Cardiac Cath - n/a  ICD Pacemaker/Loop - n/a  Sleep Study -  n/a CPAP - none  Anesthesia - Yes  ERAS: Clear liquids til 8:30 AM on DOS.  Clear liquids reviewed with patient.  STOP now taking any Aspirin (unless otherwise instructed by your surgeon), Aleve, Naproxen, Ibuprofen, Motrin, Advil, Goody's, BC's, all herbal medications, fish oil, and all vitamins.   Coronavirus Screening Covid test n/a Ambulatory Surgery  Do you have any of the following symptoms:  Cough yes/no: No Fever (>100.24F)  yes/no: No Runny nose yes/no: No Sore throat yes/no: No Difficulty breathing/shortness of breath  yes/no: No  Have you traveled in the last 14 days and where? yes/no: No  Patient verbalized understanding of instructions that were given via phone.

## 2021-07-19 NOTE — Progress Notes (Signed)
Anesthesia Chart Review: Robin York  Case: [id number] Date/Time: 07/20/21 1115   Procedure: OPEN REDUCTION INTERNAL FIXATION (ORIF) LEFT DISTAL RADIAL FRACTURE (Left)   Anesthesia type: Choice   Pre-op diagnosis: Left Distal Radius Fracture   Location: [facility] OR ROOM 05 / [facility]   Surgeons: Sherilyn Cooter, MD       DISCUSSION: Patient is a 70 year old female scheduled for the above procedure. Seen in ED 07/15/21 after slipping and landing on her left wrist sustaining a displaced left distal radius fracture and ulnar styloid avulsion. S/p reduction in ED. 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  Anesthesia team to evaluate on the day of surgery.   VS:  BP Readings from Last 3 Encounters:  07/15/21 136/74  05/23/13 126/68  10/24/11 139/63   Pulse Readings from Last 3 Encounters:  07/15/21 80  05/23/13 82  10/24/11 107     PROVIDERS: Concepcion Elk, MD is PCP  Amalia Greenhouse, MD is endocrinologist Jacqulyn Ducking, MD is oncologist - She was evaluated by cardiologist Ricky Ala, MD in [phone] for palpitations. She had a normal echo and only occasional isolated PVCs and PACs on event monitor. Trial of low dose b-blocker discussed. She is no longer on b-blocker therapy.    LABS: On the day of surgery as indicated.  She had a normal CBC and c-Met on 04/27/2021 in [place].   IMAGES: Left wrist xray (post-reduction) 07/15/21: IMPRESSION: Status post reduction of fracture of the distal left radius with gross anatomic alignment.    EKG: Last EKG > 1 year ago.    CV: US Carotid 02/27/19 ([facility]): Summary   1. There is mild atherosclerosis seen in both carotid arteries, but only   mild internal carotid artery stenosis, 1-39% bilaterally.   2. Vertebral flow is  antegrade and normal bilaterally.   3. Subclavian flow is multiphasic and normal bilaterally.   Cardiac event monitor 09/13/15: Per 10/06/15 office visit with Dr. Donnetta Hutching ([place]), event monitor showed "occasional isolated PVCs and PACs" with "no sustained arrhythmias of any sort".  Echocardiogram 12/14/16Per 10/06/15 office visit with Dr. Donnetta Hutching ([place]), echocardiogram was "completely normal'.   Past Medical History:  Diagnosis Date   Cancer [facility])    left breast cancer 2012   HLD (hyperlipidemia)    Hypothyroidism 04/21/2014   Osteoporosis    PAT (paroxysmal atrial tachycardia) (HCC)    no meds    Past Surgical History:  Procedure Laterality Date   BREAST SURGERY     bilateral mastectomies for left breast cancer 04/2011    MEDICATIONS: No current facility-administered medications for this encounter.    cephALEXin (KEFLEX) 500 MG capsule   ondansetron (ZOFRAN) 4 MG tablet   rosuvastatin (CRESTOR) 5 MG tablet   traMADol (ULTRAM) 50 MG tablet     Myra Gianotti, PA-C Surgical Short Stay/Anesthesiology [facility] Phone [phone] [facility] Phone [phone] 07/19/2021 3:01 PM

## 2021-07-19 NOTE — Anesthesia Preprocedure Evaluation (Addendum)
Anesthesia Evaluation  Patient identified by MRN, date of birth, ID band Patient awake    Reviewed: Allergy & Precautions, NPO status , Patient's Chart, lab work & pertinent test results  History of Anesthesia Complications (+) PONV and history of anesthetic complications  Airway Mallampati: II  TM Distance: >3 FB Neck ROM: Full    Dental  (+) Teeth Intact   Pulmonary neg pulmonary ROS,    Pulmonary exam normal        Cardiovascular Normal cardiovascular exam+ dysrhythmias (paroxysmal atrial tachycardia)      Neuro/Psych negative neurological ROS     GI/Hepatic Neg liver ROS, GERD  ,  Endo/Other  Hypothyroidism   Renal/GU negative Renal ROS  negative genitourinary   Musculoskeletal  (+) Arthritis ,   Abdominal   Peds  Hematology negative hematology ROS (+)   Anesthesia Other Findings   Reproductive/Obstetrics                           Anesthesia Physical Anesthesia Plan  ASA: 2  Anesthesia Plan: MAC and Regional   Post-op Pain Management:  Regional for Post-op pain   Induction: Intravenous  PONV Risk Score and Plan: 3 and Propofol infusion, TIVA and Treatment may vary due to age or medical condition  Airway Management Planned: Natural Airway, Nasal Cannula and Simple Face Mask  Additional Equipment: None  Intra-op Plan:   Post-operative Plan:   Informed Consent: I have reviewed the patients History and Physical, chart, labs and discussed the procedure including the risks, benefits and alternatives for the proposed anesthesia with the patient or authorized representative who has indicated his/her understanding and acceptance.       Plan Discussed with:   Anesthesia Plan Comments: (PAT note written 07/19/2021 by Myra Gianotti, PA-C. )     Anesthesia Quick Evaluation

## 2021-07-20 ENCOUNTER — Ambulatory Visit (HOSPITAL_COMMUNITY): Payer: Medicare Other | Admitting: Vascular Surgery

## 2021-07-20 ENCOUNTER — Encounter (HOSPITAL_COMMUNITY)
Admission: RE | Disposition: A | Discharge status: Home / Self Care | Payer: Self-pay | Source: Home / Self Care | Attending: Orthopedic Surgery

## 2021-07-20 ENCOUNTER — Other Ambulatory Visit: Payer: Self-pay

## 2021-07-20 ENCOUNTER — Ambulatory Visit (HOSPITAL_COMMUNITY): Payer: Medicare Other

## 2021-07-20 ENCOUNTER — Ambulatory Visit (HOSPITAL_COMMUNITY)
Admission: RE | Admit: 2021-07-20 | Discharge: 2021-07-20 | Disposition: A | Discharge status: Home / Self Care | Payer: Medicare Other | Attending: Orthopedic Surgery | Admitting: Orthopedic Surgery

## 2021-07-20 ENCOUNTER — Encounter (HOSPITAL_COMMUNITY): Payer: Self-pay | Admitting: Orthopedic Surgery

## 2021-07-20 DIAGNOSIS — I480 Paroxysmal atrial fibrillation: Secondary | ICD-10-CM | POA: Insufficient documentation

## 2021-07-20 DIAGNOSIS — E785 Hyperlipidemia, unspecified: Secondary | ICD-10-CM | POA: Diagnosis not present

## 2021-07-20 DIAGNOSIS — K219 Gastro-esophageal reflux disease without esophagitis: Secondary | ICD-10-CM | POA: Diagnosis not present

## 2021-07-20 DIAGNOSIS — E039 Hypothyroidism, unspecified: Secondary | ICD-10-CM | POA: Insufficient documentation

## 2021-07-20 DIAGNOSIS — Z853 Personal history of malignant neoplasm of breast: Secondary | ICD-10-CM | POA: Diagnosis not present

## 2021-07-20 DIAGNOSIS — S52572A Other intraarticular fracture of lower end of left radius, initial encounter for closed fracture: Secondary | ICD-10-CM | POA: Insufficient documentation

## 2021-07-20 DIAGNOSIS — W010XXA Fall on same level from slipping, tripping and stumbling without subsequent striking against object, initial encounter: Secondary | ICD-10-CM | POA: Diagnosis not present

## 2021-07-20 HISTORY — DX: Urinary tract infection, site not specified: N39.0

## 2021-07-20 HISTORY — PX: OPEN REDUCTION INTERNAL FIXATION (ORIF) DISTAL RADIAL FRACTURE: SHX5989

## 2021-07-20 HISTORY — DX: Nausea with vomiting, unspecified: R11.2

## 2021-07-20 HISTORY — DX: Hyperlipidemia, unspecified: E78.5

## 2021-07-20 HISTORY — DX: Gastro-esophageal reflux disease without esophagitis: K21.9

## 2021-07-20 HISTORY — DX: Other specified postprocedural states: Z98.890

## 2021-07-20 LAB — BASIC METABOLIC PANEL
Anion gap: 8 (ref 5–15)
BUN: 14 mg/dL (ref 8–23)
CO2: 28 mmol/L (ref 22–32)
Calcium: 9.7 mg/dL (ref 8.9–10.3)
Chloride: 101 mmol/L (ref 98–111)
Creatinine, Ser: 0.77 mg/dL (ref 0.44–1.00)
GFR, Estimated: >60 mL/min (ref >60)
Glucose, Bld: 95 mg/dL (ref 70–99)
Potassium: 3.7 mmol/L (ref 3.5–5.1)
Sodium: 137 mmol/L (ref 135–145)

## 2021-07-20 SURGERY — OPEN REDUCTION INTERNAL FIXATION (ORIF) DISTAL RADIUS FRACTURE
Anesthesia: Monitor Anesthesia Care | Site: Wrist | Laterality: Left

## 2021-07-20 MED ORDER — 0.9 % SODIUM CHLORIDE (POUR BTL) OPTIME
TOPICAL | Status: DC | PRN
  Administered 2021-07-20: 1000 mL

## 2021-07-20 MED ORDER — ONDANSETRON HCL 4 MG/2ML IJ SOLN
INTRAMUSCULAR | Status: DC | PRN
  Administered 2021-07-20: 4 mg via INTRAVENOUS

## 2021-07-20 MED ORDER — LIDOCAINE 2% (20 MG/ML) 5 ML SYRINGE
INTRAMUSCULAR | Status: DC | PRN
  Administered 2021-07-20: 100 mg via INTRAVENOUS

## 2021-07-20 MED ORDER — PROPOFOL 500 MG/50ML IV EMUL
INTRAVENOUS | Status: DC | PRN
  Administered 2021-07-20: 75 ug/kg/min via INTRAVENOUS

## 2021-07-20 MED ORDER — FENTANYL CITRATE (PF) 100 MCG/2ML IJ SOLN
INTRAMUSCULAR | Status: AC
  Administered 2021-07-20: 50 ug via INTRAVENOUS
  Filled 2021-07-20: qty 2

## 2021-07-20 MED ORDER — FENTANYL CITRATE (PF) 100 MCG/2ML IJ SOLN: 25.0000 ug | INTRAMUSCULAR | Status: DC | PRN

## 2021-07-20 MED ORDER — ONDANSETRON HCL 4 MG/2ML IJ SOLN: 4.0000 mg | Freq: Once | INTRAMUSCULAR | Status: DC | PRN

## 2021-07-20 MED ORDER — CHLORHEXIDINE GLUCONATE 0.12 % MT SOLN: 15.0000 mL | Freq: Once | OROMUCOSAL | Status: AC

## 2021-07-20 MED ORDER — LACTATED RINGERS IV SOLN: INTRAVENOUS | Status: DC

## 2021-07-20 MED ORDER — ORAL CARE MOUTH RINSE: 15.0000 mL | Freq: Once | OROMUCOSAL | Status: AC

## 2021-07-20 MED ORDER — PROPOFOL 10 MG/ML IV BOLUS
INTRAVENOUS | Status: DC | PRN
  Administered 2021-07-20: 40 mg via INTRAVENOUS

## 2021-07-20 MED ORDER — MIDAZOLAM HCL 2 MG/2ML IJ SOLN: 1.0000 mg | Freq: Once | INTRAMUSCULAR | Status: AC

## 2021-07-20 MED ORDER — AMISULPRIDE (ANTIEMETIC) 5 MG/2ML IV SOLN: 10.0000 mg | Freq: Once | INTRAVENOUS | Status: DC | PRN

## 2021-07-20 MED ORDER — ROPIVACAINE HCL 5 MG/ML IJ SOLN
INTRAMUSCULAR | Status: DC | PRN
  Administered 2021-07-20: 30 mL via PERINEURAL

## 2021-07-20 MED ORDER — OXYCODONE HCL 5 MG PO TABS: 5.0000 mg | ORAL_TABLET | Freq: Once | ORAL | Status: DC | PRN

## 2021-07-20 MED ORDER — PHENYLEPHRINE HCL-NACL 20-0.9 MG/250ML-% IV SOLN
INTRAVENOUS | Status: DC | PRN
  Administered 2021-07-20: 25 ug/min via INTRAVENOUS

## 2021-07-20 MED ORDER — FENTANYL CITRATE (PF) 100 MCG/2ML IJ SOLN: 50.0000 ug | Freq: Once | INTRAMUSCULAR | Status: AC

## 2021-07-20 MED ORDER — OXYCODONE HCL 5 MG/5ML PO SOLN: 5.0000 mg | Freq: Once | ORAL | Status: DC | PRN

## 2021-07-20 MED ORDER — CEFAZOLIN SODIUM-DEXTROSE 2-4 GM/100ML-% IV SOLN
2.0000 g | INTRAVENOUS | Status: AC
  Administered 2021-07-20: 2 g via INTRAVENOUS

## 2021-07-20 MED ORDER — TRAMADOL HCL 50 MG PO TABS: 50.0000 mg | ORAL_TABLET | Freq: Four times a day (QID) | ORAL | 0 refills | Status: AC | PRN

## 2021-07-20 MED ORDER — DEXMEDETOMIDINE (PRECEDEX) IN NS 20 MCG/5ML (4 MCG/ML) IV SYRINGE
PREFILLED_SYRINGE | INTRAVENOUS | Status: DC | PRN
  Administered 2021-07-20: 12 ug via INTRAVENOUS

## 2021-07-20 MED ORDER — BUPIVACAINE HCL (PF) 0.25 % IJ SOLN
INTRAMUSCULAR | Status: AC
  Filled 2021-07-20: qty 30

## 2021-07-20 MED ORDER — CHLORHEXIDINE GLUCONATE 0.12 % MT SOLN
OROMUCOSAL | Status: AC
  Administered 2021-07-20: 15 mL via OROMUCOSAL
  Filled 2021-07-20: qty 15

## 2021-07-20 MED ORDER — MIDAZOLAM HCL 2 MG/2ML IJ SOLN
INTRAMUSCULAR | Status: AC
  Administered 2021-07-20: 1 mg via INTRAVENOUS
  Filled 2021-07-20: qty 2

## 2021-07-20 MED ORDER — DEXAMETHASONE SODIUM PHOSPHATE 10 MG/ML IJ SOLN
INTRAMUSCULAR | Status: DC | PRN
  Administered 2021-07-20: 5 mg

## 2021-07-20 MED ORDER — CEFAZOLIN SODIUM-DEXTROSE 2-4 GM/100ML-% IV SOLN
INTRAVENOUS | Status: AC
  Filled 2021-07-20: qty 100

## 2021-07-20 SURGICAL SUPPLY — 65 items
BAG COUNTER SPONGE SURGICOUNT (BAG) ×2 IMPLANT
BIT DRILL SOLID 2.0X40MM (BIT) ×1 IMPLANT
BIT DRILL SOLID 2.5X40MM (BIT) ×1 IMPLANT
BLADE CLIPPER SURG (BLADE) IMPLANT
BNDG ELASTIC 3X5.8 VLCR STR LF (GAUZE/BANDAGES/DRESSINGS) ×2 IMPLANT
BNDG ELASTIC 4X5.8 VLCR STR LF (GAUZE/BANDAGES/DRESSINGS) ×2 IMPLANT
BNDG ESMARK 4X9 LF (GAUZE/BANDAGES/DRESSINGS) ×2 IMPLANT
BNDG GAUZE ELAST 4 BULKY (GAUZE/BANDAGES/DRESSINGS) ×2 IMPLANT
CANISTER SUCT 3000ML PPV (MISCELLANEOUS) ×2 IMPLANT
CORD BIPOLAR FORCEPS 12FT (ELECTRODE) ×2 IMPLANT
COVER SURGICAL LIGHT HANDLE (MISCELLANEOUS) ×2 IMPLANT
CUFF TOURN SGL QUICK 18X4 (TOURNIQUET CUFF) ×2 IMPLANT
CUFF TOURN SGL QUICK 24 (TOURNIQUET CUFF)
CUFF TRNQT CYL 24X4X16.5-23 (TOURNIQUET CUFF) IMPLANT
DECANTER SPIKE VIAL GLASS SM (MISCELLANEOUS) ×2 IMPLANT
DRAPE OEC MINIVIEW 54X84 (DRAPES) ×2 IMPLANT
DRAPE SURG 17X11 SM STRL (DRAPES) ×2 IMPLANT
DRILL SOLID 2.0X40MM (BIT) ×2
DRILL SOLID 2.5X40MM (BIT) ×2
DRSG ADAPTIC 3X8 NADH LF (GAUZE/BANDAGES/DRESSINGS) ×2 IMPLANT
GAUZE 4X4 16PLY ~~LOC~~+RFID DBL (SPONGE) ×2 IMPLANT
GAUZE SPONGE 4X4 12PLY STRL (GAUZE/BANDAGES/DRESSINGS) ×2 IMPLANT
GAUZE XEROFORM 1X8 LF (GAUZE/BANDAGES/DRESSINGS) ×2 IMPLANT
GAUZE XEROFORM 5X9 LF (GAUZE/BANDAGES/DRESSINGS) ×2 IMPLANT
GLOVE SURG ORTHO LTX SZ8 (GLOVE) ×2 IMPLANT
GLOVE SURG UNDER POLY LF SZ8.5 (GLOVE) ×2 IMPLANT
GOWN STRL REUS W/ TWL LRG LVL3 (GOWN DISPOSABLE) ×1 IMPLANT
GOWN STRL REUS W/ TWL XL LVL3 (GOWN DISPOSABLE) ×1 IMPLANT
GOWN STRL REUS W/TWL LRG LVL3 (GOWN DISPOSABLE) ×1
GOWN STRL REUS W/TWL XL LVL3 (GOWN DISPOSABLE) ×1
GUIDE AIMING 1.5MM (WIRE) ×4 IMPLANT
KIT BASIN OR (CUSTOM PROCEDURE TRAY) ×2 IMPLANT
KIT TURNOVER KIT B (KITS) ×2 IMPLANT
NEEDLE HYPO 25X1 1.5 SAFETY (NEEDLE) ×2 IMPLANT
NS IRRIG 1000ML POUR BTL (IV SOLUTION) ×2 IMPLANT
PACK ORTHO EXTREMITY (CUSTOM PROCEDURE TRAY) ×2 IMPLANT
PAD ARMBOARD 7.5X6 YLW CONV (MISCELLANEOUS) ×4 IMPLANT
PAD CAST 4YDX4 CTTN HI CHSV (CAST SUPPLIES) ×1 IMPLANT
PADDING CAST COTTON 4X4 STRL (CAST SUPPLIES) ×1
PEG GEMINUS LOCK HCLP 2.7X18 (Peg) ×4 IMPLANT
PEG GEMINUS SMOOTH LOCK 2.0X17 (Peg) ×2 IMPLANT
PEG SMOOTH LOCK 2.0X18 (Screw) ×8 IMPLANT
PLATE STD 3H LEFT (Plate) ×2 IMPLANT
SCREW CORT LOCK 3.5X10 TI (Screw) ×2 IMPLANT
SCREW CORT LOCK 3.5X12 TI (Screw) ×2 IMPLANT
SCREW POLY NON LOCK 3.5MMX12MM (Screw) ×6 IMPLANT
SCREWDRIVER SURG ST 2 (INSTRUMENTS) ×4 IMPLANT
SOAP 2 % CHG 4 OZ (WOUND CARE) ×2 IMPLANT
SPONGE T-LAP 4X18 ~~LOC~~+RFID (SPONGE) ×2 IMPLANT
SUT ETHILON 4 0 PS 2 18 (SUTURE) ×4 IMPLANT
SUT MNCRL AB 3-0 PS2 18 (SUTURE) ×2 IMPLANT
SUT PROLENE 4 0 PS 2 18 (SUTURE) IMPLANT
SUT VIC AB 2-0 CT1 27 (SUTURE)
SUT VIC AB 2-0 CT1 TAPERPNT 27 (SUTURE) IMPLANT
SUT VIC AB 3-0 PS2 18 (SUTURE) ×1
SUT VIC AB 3-0 PS2 18XBRD (SUTURE) ×1 IMPLANT
SUT VICRYL 4-0 PS2 18IN ABS (SUTURE) IMPLANT
SYR CONTROL 10ML LL (SYRINGE) IMPLANT
TOWEL GREEN STERILE (TOWEL DISPOSABLE) ×2 IMPLANT
TOWEL GREEN STERILE FF (TOWEL DISPOSABLE) IMPLANT
TUBE CONNECTING 12X1/4 (SUCTIONS) ×2 IMPLANT
WATER STERILE IRR 1000ML POUR (IV SOLUTION) ×2 IMPLANT
WIRE FIX 1.5 STANDARD TIP (WIRE) ×6
WIRE FIX 1.5 STD TIP (WIRE) ×3 IMPLANT
YANKAUER SUCT BULB TIP NO VENT (SUCTIONS) IMPLANT

## 2021-07-20 NOTE — Transfer of Care (Signed)
Immediate Anesthesia Transfer of Care Note  Patient: Robin York  Procedure(s) Performed: OPEN REDUCTION INTERNAL FIXATION (ORIF) LEFT DISTAL RADIAL FRACTURE (Left: Wrist)  Patient Location: PACU  Anesthesia Type:MAC  Level of Consciousness: drowsy and patient cooperative  Airway & Oxygen Therapy: Patient Spontanous Breathing  Post-op Assessment: Report given to RN and Post -op Vital signs reviewed and stable  Post vital signs: Reviewed and stable  Last Vitals:  Vitals Value Taken Time  BP 114/67 07/20/21 1510  Temp 36.1 C 07/20/21 1510  Pulse 90 07/20/21 1514  Resp 19 07/20/21 1514  SpO2 96 % 07/20/21 1514  Vitals shown include unvalidated device data.  Last Pain:  Vitals:   07/20/21 1510  TempSrc:   PainSc: 0-No pain         Complications: No notable events documented.

## 2021-07-20 NOTE — Anesthesia Postprocedure Evaluation (Signed)
Anesthesia Post Note  Patient: Robin York  Procedure(s) Performed: OPEN REDUCTION INTERNAL FIXATION (ORIF) LEFT DISTAL RADIAL FRACTURE (Left: Wrist)     Patient location during evaluation: PACU Anesthesia Type: Regional Level of consciousness: awake and alert Pain management: pain level controlled Vital Signs Assessment: post-procedure vital signs reviewed and stable Respiratory status: spontaneous breathing, nonlabored ventilation and respiratory function stable Cardiovascular status: blood pressure returned to baseline and stable Postop Assessment: no apparent nausea or vomiting Anesthetic complications: no   No notable events documented.  Last Vitals:  Vitals:   07/20/21 1525 07/20/21 1540  BP: 121/68 132/75  Pulse: 81 82  Resp: 10 15  Temp:  36.7 C  SpO2: 96% 97%    Last Pain:  Vitals:   07/20/21 1540  TempSrc:   PainSc: 0-No pain                 Lidia Collum

## 2021-07-20 NOTE — Op Note (Signed)
Date of Surgery: 07/20/2021  INDICATIONS: Ms. Crail is a 70 y.o.-year-old female with a left intra-articular distal radius fracture.  Risks, benefits, and alternatives to surgery were discussed.  Informed consent was signed in the preoperative holding area.   PREOPERATIVE DIAGNOSIS:  Left intra-articular distal radius fracture, 3 or more fragments  POSTOPERATIVE DIAGNOSIS: Same.  PROCEDURE:  ORIF intra-articular L distal radius fracture, 3+ fragments (52778)   SURGEON: Audria Nine, M.D.  ASSIST:   ANESTHESIA:  Regional  IV FLUIDS AND URINE: See anesthesia.  ESTIMATED BLOOD LOSS: 10 mL.  IMPLANTS:  Implant Name Type Inv. Item Serial No. Manufacturer Lot No. LRB No. Used Action  Volar Distal Radius Plate System- standard 3 hole, left     ON TRAY Left 1 Implanted  VOLAR DISTAL RADIUS PLATE SYSTEM- HI COMP LOCK, 2.7 X18     ON TRAY Left 2 Implanted  VOLAR DISTAL RADIUS PLATE SYSTEM- SMOOTH PEG, CORTICAL NON-LOCKING 3.5 X 12     ON TRAY Left 1 Implanted  SCREW POLY NON LOCK 3.2UMP53IR - [id number] Screw SCREW POLY NON LOCK 3.6PYP95KD  SKELETAL DYNAMICS ON TRAY Left 1 Implanted  SCREW CORT LOCK 3.5X12 TI - [id number] Screw SCREW CORT LOCK 3.5X12 TI  SKELETAL DYNAMICS ON TRAY Left 1 Implanted  SCREW CORT LOCK 3.5X10 TI - [id number] Screw SCREW CORT LOCK 3.5X10 TI  SKELETAL DYNAMICS ON TRAY Left 1 Implanted  PEG SMOOTH LOCK 2.0X18 - [id number] Screw PEG SMOOTH LOCK 2.0X18  SKELETAL DYNAMICS ON TRAY Left 4 Implanted  PEG GEMINUS SMOOTH LOCK 2.0X17 - [id number] Peg PEG GEMINUS SMOOTH LOCK 2.0X17  SKELETAL DYNAMICS ON TRAY Left 1 Implanted     DRAINS: None  COMPLICATIONS: see description of procedure.  DESCRIPTION OF PROCEDURE: The patient was met in the preoperative holding area where the surgical site was marked and the consent form was verified.  The patient was then taken to the operating room and transferred to the operating table.  All bony prominences were well padded.  A  tourniquet was applied to the left upper arm.  The operative extremity was prepped and draped in the usual and sterile fashion.  A formal time-out was performed to confirm that this was the correct patient, surgery, side, and site.   Following timeout, the limb was exsanguinated with an Esmarch bandage and the tourniquet inflated to 250 mmHg.  Extended FCR approach was made.  The skin subcutaneous tissue was divided.  Bipolar cautery was used for the small crossing vessels.  The FCR tendon was identified.  In the distal aspect of the wound crossing the wrist crease the superficial branch of the radial artery was identified and protected.  The fascia overlying the FCR tendon was divided.  The thick fascia overlying the FCR at the level of the trapezial ridge was also sharply divided.  The FCR tendon was then taken ulnarly and the floor of the FCR tendon sheath was incised.  The second floor of the FCR sheath at the level of the trapezial ridge was also incised.  Blunt dissection was used to identify the FPL muscle belly and develop Parona space.  The brachial radialis tendon was identified.  A step cut was made in the tendon to allow for later repair of the pronator.  The pronator quadratus was sharply elevated off of the volar surface of the radius taking care to include the distal transitional fibrous zone.  The comminuted fracture was debrided of interposed soft tissue and hematoma.  Provisional reduction was obtained using  a combination of traction and extension followed by flexion and dorsal pressure on the distal fragment.  Preliminary x-ray shows an appropriate reduction.  Finger traps were applied to the hand and sent and 1/2 pounds of weight was placed off of the end of the hand table.  A 3 hole left standard Geminus plate was then chosen.  The plate was then positioned on the volar aspect of the radius.  With appropriate position in the AP view the plate was then pinned in place.  A cortical screw was  then placed in the oblong hole.  The plate was adjusted in the lateral view such that the distal aspect of the plate was just under the watershed line.  Appropriate reduction was again obtained.  K wires were then placed and the aiming guides on the ulnar and radial heads of the plate.   A 30 degree lateral view showed that these wires were appropriately subchondral and in good position.  A high compression screw was then placed in the ulnar and radial head of the plate.  The remaining screw holes were then filled using smooth locking pegs taking care that each peg did not extend beyond the dorsal cortex.  Locking screws were then placed in the remaining holes in the shaft.  Final fluoroscopic imaging demonstrated the appropriate plate position of both the AP and lateral views.  The previous step-off between the radial styloid and lunate facet was corrected.  There is appropriate volar tilt of the radius.  The plate did not extend beyond the watershed line, however, it was sitting off the bone slightly.  A 30 degree lateral views show that all screws were appropriately subchondral.  A dorsal tangential view demonstrated that all screws were within the radius and did not extend past the dorsal cortex.  Satisfied with our fixation the wound was then thoroughly irrigated with sterile saline.  The tourniquet was let down and hemostasis achieved using direct pressure and bipolar electrocautery.  The wound was closed in layered fashion.  The pronator quadratus was of somewhat poor quality but attempted repair to the previously cut brachioradialis was made.  The skin was then closed in a layered fashion using 3-0 Monocryl sutures followed by 4-0 nylon sutures in a horizontal mattress fashion.  The wound was then dressed using Xeroform, 4 x 4, cast padding, and a volar splint.  The patient was then reversed from anesthesia and transferred to the postoperative bed. All counts were correct x 2 at the end of the  procedure.  The patient was then taken to the PACU in stable condition.    POSTOPERATIVE PLAN: The patient will be discharged to home with appropriate instructions and pain medications.  I will see her back in 10-14 days at which time her sutures will be removed.  She will then start hand therapy.   Audria Nine, MD 3:18 PM

## 2021-07-20 NOTE — Brief Op Note (Signed)
07/20/2021  3:14 PM  PATIENT:  Robin York  70 y.o. female  PRE-OPERATIVE DIAGNOSIS:  Left Distal Radius Fracture  POST-OPERATIVE DIAGNOSIS:  Left Distal Radius Fracture  PROCEDURE:  Procedure(s): OPEN REDUCTION INTERNAL FIXATION (ORIF) LEFT DISTAL RADIAL FRACTURE (Left)  SURGEON:  Surgeon(s) and Role:    * Sherilyn Cooter, MD - Primary  PHYSICIAN ASSISTANT:   ASSISTANTS: none   ANESTHESIA:   regional  EBL:  10cc   BLOOD ADMINISTERED:none  DRAINS: none   LOCAL MEDICATIONS USED:  NONE  SPECIMEN:  No Specimen  DISPOSITION OF SPECIMEN:  N/A  COUNTS:  YES  TOURNIQUET:   Total Tourniquet Time Documented: Upper Arm (Left) - 86 minutes Total: Upper Arm (Left) - 86 minutes   DICTATION: .Viviann Spare Dictation  PLAN OF CARE: Discharge to home after PACU  PATIENT DISPOSITION:  PACU - hemodynamically stable.   Delay start of Pharmacological VTE agent (>24hrs) due to surgical blood loss or risk of bleeding: not applicable

## 2021-07-20 NOTE — Discharge Instructions (Signed)
Audria Nine, M.D. Hand Surgery  POST-OPERATIVE DISCHARGE INSTRUCTIONS   PRESCRIPTIONS: You have been given a prescription to be taken as directed for post-operative pain control.  You may also take over the counter ibuprofen/aleve and tylenol for pain. Take this as directed on the packaging. Do not exceed 3000 mg tylenol/acetaminophen in 24 hours.  Ibuprofen 600-800 mg (3-4) tablets by mouth every 6 hours as needed for pain.  OR Aleve 2 tablets by mouth every 12 hours (twice daily) as needed for pain.  AND/OR Tylenol 1000 mg (2 tablets) every 8 hours as needed for pain.  Please use your pain medication carefully, as refills are limited and you may not be provided with one.  As stated above, please use over the counter pain medicine - it will also be helpful with decreasing your swelling.    ANESTHESIA: After your surgery, post-surgical discomfort or pain is likely. This discomfort can last several days to a few weeks. At certain times of the day your discomfort may be more intense.   Did you receive a nerve block?  A nerve block can provide pain relief for one hour to two days after your surgery. As long as the nerve block is working, you will experience little or no sensation in the area the surgeon operated on.  As the nerve block wears off, you will begin to experience pain or discomfort. It is very important that you begin taking your prescribed pain medication before the nerve block fully wears off. Treating your pain at the first sign of the block wearing off will ensure your pain is better controlled and more tolerable when full-sensation returns. Do not wait until the pain is intolerable, as the medicine will be less effective. It is better to treat pain in advance than to try and catch up.   General Anesthesia:  If you did not receive a nerve block during your surgery, you will need to start taking your pain medication shortly after your surgery and should continue to do  so as prescribed by your surgeon.     ICE AND ELEVATION: You may use ice for the first 48-72 hours, but it is not critical.   Motion of your fingers is very important s to decrease the swelling. Please follow the finger range of motion exercises below to assist you in regaining your motion. This should be done at least 10 repetitions 3-4 times a day.  Elevation, as much as possible for the next 48 hours, is critical for decreasing swelling as well as for pain relief. Elevation means when you are seated or lying down, you hand should be at or above your heart. When walking, the hand needs to be at or above the level of your elbow.  If the bandage gets too tight, it may need to be loosened. Please contact our office and we will instruct you in how to do this.    SURGICAL BANDAGES:  Keep your dressing and/or splint clean and dry at all times.  Do not remove until you are seen again in the office.  If careful, you may place a plastic bag over your bandage and tape the end to shower, but be careful, do not get your bandages wet.     HAND THERAPY:  You may not need any. If you do, we will begin this at your follow up visit in the clinic.    ACTIVITY AND WORK: You are encouraged to move any fingers which are not in the  bandage. Attached is an instruction sheet on finger motion. Do this as much as you need to make your fingers move fully and keep the swelling down.  Light use of the fingers is allowed to assist the other hand with daily hygiene and eating, but strong gripping or lifting is often uncomfortable and should be avoided.  You might miss a variable period of time from work and hopefully this issue has been discussed prior to surgery. You may not do any heavy work with your affected hand for about 2 weeks.    [facility] [address] [place],  [place]  [address] [phone]

## 2021-07-20 NOTE — Anesthesia Procedure Notes (Signed)
Anesthesia Regional Block: Supraclavicular block   Pre-Anesthetic Checklist: , timeout performed,  Correct Patient, Correct Site, Correct Laterality,  Correct Procedure, Correct Position, site marked,  Risks and benefits discussed,  Surgical consent,  Pre-op evaluation,  At surgeon's request and post-op pain management  Laterality: Left  Prep: chloraprep       Needles:  Injection technique: Single-shot  Needle Type: Echogenic Stimulator Needle     Needle Length: 10cm  Needle Gauge: 20     Additional Needles:   Procedures:,,,, ultrasound used (permanent image in chart),,    Narrative:  Start time: 07/20/2021 10:35 AM End time: 07/20/2021 10:40 AM Injection made incrementally with aspirations every 5 mL.  Performed by: Personally  Anesthesiologist: Lidia Collum, MD  Additional Notes: Standard monitors applied. Skin prepped. Good needle visualization with ultrasound. Injection made in 5cc increments with no resistance to injection. Patient tolerated the procedure well.

## 2021-07-20 NOTE — Interval H&P Note (Signed)
History and Physical Interval Note:  07/20/2021 12:03 PM  Robin York  has presented today for surgery, with the diagnosis of Left Distal Radius Fracture.  The various methods of treatment have been discussed with the patient and family. After consideration of risks, benefits and other options for treatment, the patient has consented to  Procedure(s): OPEN REDUCTION INTERNAL FIXATION (ORIF) LEFT DISTAL RADIAL FRACTURE (Left) as a surgical intervention.  The patient's history has been reviewed, patient examined, no change in status, stable for surgery.  I have reviewed the patient's chart and labs.  Questions were answered to the patient's satisfaction.     Myriah Boggus Ladainian Therien

## 2021-07-27 ENCOUNTER — Encounter (HOSPITAL_COMMUNITY): Payer: Self-pay | Admitting: Orthopedic Surgery

## 2021-07-30 ENCOUNTER — Telehealth: Payer: Self-pay | Admitting: Orthopedic Surgery

## 2021-07-30 NOTE — Telephone Encounter (Signed)
Continue to elevate as much as possible.  Doubt ice will help as I assume she is in a splint.  Take pain meds as needed.  If able to also take nsaids, can take these as well

## 2021-07-30 NOTE — Telephone Encounter (Signed)
Pt called stating she had surgery on 07/20/21 and she has some swelling and pain still. Pt would like a CB to be advised if she needs to come in or if theres anything she can try at home.   [phone]

## 2021-07-30 NOTE — Telephone Encounter (Signed)
I called patient and advised. 

## 2021-08-03 ENCOUNTER — Ambulatory Visit (INDEPENDENT_AMBULATORY_CARE_PROVIDER_SITE_OTHER): Payer: Medicare Other | Admitting: Orthopedic Surgery

## 2021-08-03 ENCOUNTER — Ambulatory Visit (INDEPENDENT_AMBULATORY_CARE_PROVIDER_SITE_OTHER): Payer: Medicare Other

## 2021-08-03 ENCOUNTER — Other Ambulatory Visit: Payer: Self-pay

## 2021-08-03 DIAGNOSIS — S52572A Other intraarticular fracture of lower end of left radius, initial encounter for closed fracture: Secondary | ICD-10-CM

## 2021-08-03 NOTE — Progress Notes (Signed)
Post-Op Visit Note   Patient: Robin York           Date of Birth: Oct 25, 1950           MRN: [id number] Visit Date: 08/03/2021 PCP: Concepcion Elk, MD   Assessment & Plan:  Chief Complaint:  Chief Complaint  Patient presents with   Left Wrist - Routine Post Op    Doing good, still sore, splint removed and incision looks good   Visit Diagnoses:  1. Other closed intra-articular fracture of distal end of left radius, initial encounter     Plan: She is doing well postoperatively.  Her incision is clean and dry.  Sutures were removed today.  She has some stiffness in the MP and IP joints of her thumb with intact AROM. X-rays today show maintained fracture alignment with appropriate plate position.  We will start her in hand therapy.  I will see her in two weeks.   Follow-Up Instructions: No follow-ups on file.   Orders:  Orders Placed This Encounter  Procedures   XR Wrist Complete Left   No orders of the defined types were placed in this encounter.   Imaging: No results found.  PMFS History: Patient Active Problem List   Diagnosis Date Noted   Closed fracture of left distal radius 07/16/2021   Cognitive complaints with normal neuropsychological exam 03/18/2021   Encounter for monitoring denosumab therapy 11/02/2017   Bilateral impacted cerumen 10/03/2017   Recurrent UTI 08/31/2017   Encounter for monitoring aromatase inhibitor therapy 12/16/2015   Palpitation 08/18/2015   Encounter for follow-up surveillance of breast cancer 06/09/2015   Abnormal auditory perception 06/27/2014   Atrial premature depolarization 06/27/2014   Atrophic vaginitis 06/27/2014   Chronic cystitis 06/27/2014   Hyperlipemia 06/27/2014   Vitamin D deficiency 06/27/2014   Age-related osteoporosis without current pathological fracture 04/21/2014   GERD (gastroesophageal reflux disease) 04/21/2014   Hypothyroidism 04/21/2014   Malignant neoplasm of upper-outer quadrant of left breast in female,  estrogen receptor positive ([place]) 04/21/2014   Osteoporosis 04/21/2014   Primary localized osteoarthrosis of hand 02/23/2013   Past Medical History:  Diagnosis Date   Cancer ([place]) 2012   left breast cancer, stage I invasive lobular carcinoma   Frequent UTI    GERD (gastroesophageal reflux disease)    HLD (hyperlipidemia)    Hypothyroidism 04/21/2014   Osteoporosis    PAT (paroxysmal atrial tachycardia) ([facility])    no meds, no current problem   PONV (postoperative nausea and vomiting)     No family history on file.  Past Surgical History:  Procedure Laterality Date   BREAST SURGERY Bilateral 04/2011   bilateral mastectomies for left breast cancer with reconstruction   COLONOSCOPY  07/2020   FINGER SURGERY Left 07/2020   left hand -index finger   LAPAROSCOPIC ASSISTED VAGINAL HYSTERECTOMY     OPEN REDUCTION INTERNAL FIXATION (ORIF) DISTAL RADIAL FRACTURE Left 07/20/2021   Procedure: OPEN REDUCTION INTERNAL FIXATION (ORIF) LEFT DISTAL RADIAL FRACTURE;  Surgeon: Sherilyn Cooter, MD;  Location: [facility];  Service: Orthopedics;  Laterality: Left;   Social History   Occupational History   Not on file  Tobacco Use   Smoking status: Never   Smokeless tobacco: Never  Vaping Use   Vaping Use: Never used  Substance and Sexual Activity   Alcohol use: Yes    Alcohol/week: 7.0 standard drinks    Types: 7 Glasses of wine per week   Drug use: No   Sexual activity: Yes  Birth control/protection: None    Comment: Hysterectomy

## 2021-08-03 NOTE — Addendum Note (Signed)
Addended by: Sherilyn Cooter on: 08/03/2021 10:15 AM   Modules accepted: Orders

## 2021-08-04 ENCOUNTER — Ambulatory Visit: Payer: Medicare Other | Admitting: Occupational Therapy

## 2021-08-04 ENCOUNTER — Encounter: Payer: Self-pay | Admitting: Occupational Therapy

## 2021-08-04 DIAGNOSIS — R601 Generalized edema: Secondary | ICD-10-CM

## 2021-08-04 DIAGNOSIS — R278 Other lack of coordination: Secondary | ICD-10-CM

## 2021-08-04 DIAGNOSIS — M79642 Pain in left hand: Secondary | ICD-10-CM

## 2021-08-04 DIAGNOSIS — M25642 Stiffness of left hand, not elsewhere classified: Secondary | ICD-10-CM | POA: Diagnosis not present

## 2021-08-04 DIAGNOSIS — M25532 Pain in left wrist: Secondary | ICD-10-CM | POA: Diagnosis not present

## 2021-08-04 DIAGNOSIS — M6281 Muscle weakness (generalized): Secondary | ICD-10-CM

## 2021-08-04 NOTE — Therapy (Signed)
[facility] [address] [place], [place], [address] Phone: [phone]   Fax:  [phone]  Occupational Therapy Evaluation  Patient Details  Name: Robin York MRN: [id number] Date of Birth: 1950-11-05 Referring Provider (OT): Dr Tempie Donning   Encounter Date: 08/04/2021   OT End of Session - 08/04/21 1116     Visit Number 1    Number of Visits 12    Date for OT Re-Evaluation 09/01/21   Every 10th visit   Authorization Type BCBS Medicare    Authorization - Visit Number 1    Progress Note Due on Visit 10    OT Start Time 0930    OT Stop Time 1047    OT Time Calculation (min) 77 min    Activity Tolerance Patient tolerated treatment well    Behavior During Therapy [facility] for tasks assessed/performed             Past Medical History:  Diagnosis Date   Cancer ([place]) 2012   left breast cancer, stage I invasive lobular carcinoma   Frequent UTI    GERD (gastroesophageal reflux disease)    HLD (hyperlipidemia)    Hypothyroidism 04/21/2014   Osteoporosis    PAT (paroxysmal atrial tachycardia) (HCC)    no meds, no current problem   PONV (postoperative nausea and vomiting)     Past Surgical History:  Procedure Laterality Date   BREAST SURGERY Bilateral 04/2011   bilateral mastectomies for left breast cancer with reconstruction   COLONOSCOPY  07/2020   FINGER SURGERY Left 07/2020   left hand -index finger   LAPAROSCOPIC ASSISTED VAGINAL HYSTERECTOMY     OPEN REDUCTION INTERNAL FIXATION (ORIF) DISTAL RADIAL FRACTURE Left 07/20/2021   Procedure: OPEN REDUCTION INTERNAL FIXATION (ORIF) LEFT DISTAL RADIAL FRACTURE;  Surgeon: Sherilyn Cooter, MD;  Location: [place];  Service: Orthopedics;  Laterality: Left;    There were no vitals filed for this visit.   Subjective Assessment - 08/04/21 0932     Subjective  Pt presents with a left distal radius fracture after falling in her closet. She sustained a distal radius DOI: 06/17/21, DOS: 07/20/21.  She is currently 2 weeks and 1 day post op    Pertinent History Please refer to pt chart for details    Patient Stated Goals Get use of left UE and hand back    Currently in Pain? No/denies    Pain Score 0-No pain    Multiple Pain Sites No               [facility] OT Assessment - 08/04/21 0001       Assessment   Medical Diagnosis L intra-articular distal radius fracture s/p ORIF    Referring Provider (OT) Dr Tempie Donning    Onset Date/Surgical Date 07/20/21   DOI: 06/17/21   Hand Dominance Right    Next MD Visit 2 weeks      Balance Screen   Has the patient fallen in the past 6 months Yes    How many times? no    Has the patient had a decrease in activity level because of a fear of falling?  No      Home  Environment   Family/patient expects to be discharged to: Private residence    Lives With Spouse      Prior Function   Level of Independence Independent with basic ADLs    Vocation Retired    Leisure [place],      ADL   ADL comments Pt reports that  she needs assistance with certain ADL's, cutting food, donning cast cover, taking covers off of shampoo, fastening her bra etc... husband assists PRN.      Mobility   Mobility Status Independent      Written Expression   Dominant Hand Right      Cognition   Overall Cognitive Status Within Functional Limits for tasks assessed      Observation/Other Assessments   Observations Pt presents in left arm sling and post op dressing, edema in left hand.      Coordination   Gross Motor Movements are Fluid and Coordinated No   NT   Fine Motor Movements are Fluid and Coordinated No   NT     Edema   Edema Moderate edema noted in left hand      ROM / Strength   AROM / PROM / Strength AROM;Strength      AROM   Overall AROM  Deficits;Unable to assess      Strength   Overall Strength Deficits;Unable to assess      Hand Function   Comment Finger A/ROM is impaired secondary to edema and post-op dressing/cast as well as sling use  LUE. Pt was educated in A/ROM to elbow and shoulder at this time.                OT Treatments/Exercises (OP) - 08/04/21 0001       ADLs   ADL Comments Pt was educated in splinting use, care and precautions L UE. She was also educated to keep hand/wrist dry and wrap when bathing. She may remove that splint for hygeine.      Exercises   Exercises Wrist;Hand      Hand Exercises   Other Hand Exercises Pt was educated in a HEP for active blocked MP/PIP/DIP flexion and extension, composite, full and hook fisting and opposition ex's. Pt was also educated in passive fist and hold to thumb and fingers secondary to moderate edema in left hand and wrist. Handouts were provided for all of the above.      Splinting   Splinting Pt bulky dressing was removed in the clinic and a custom fabricated wrst orthosis was made for her left. He wrist is on neutral/slight extension and her digits are free for A/ROM and P/ROM. She was redressed using compressive stockinette to assist with edema control. Pt was educated in splinting use, care and precautions and she was given verbal and written instruction.      Manual Therapy   Manual Therapy Edema management    Edema Management Pt was educated in edema control techniques and instructed in retrograde massage at home for digits, hand, wrist and forearm left. Demonstration was provided and pt verbalized understanding.                OT Education - 08/04/21 1114     Education Details Splinting use, care and precautions, initial HEP, edema control L UE/hand    Person(s) Educated Patient    Methods Explanation;Demonstration;Tactile cues;Verbal cues;Handout    Comprehension Verbalized understanding;Need further instruction              OT Short Term Goals - 08/04/21 1131       OT SHORT TERM GOAL #1   Title Pt will be Mod I splinting use, care and precautions left UE    Time 4    Period Weeks    Status New    Target Date 09/01/21      OT  SHORT  TERM GOAL #2   Title Pt will be mod I initial HEP for L wrist and hand as seen by mod I ability to perform in the clinic    Time 4    Period Weeks    Status New    Target Date 09/01/21      OT SHORT TERM GOAL #3   Title Pt will be Mod I edema control techniques L UE as seen by pt ability to state 2-3 techniques w/ 1 vc or less    Time 4    Period Weeks    Status New    Target Date 09/01/21               OT Long Term Goals - 08/04/21 1133       OT LONG TERM GOAL #1   Title Pt will be Mod I upgraded HEP L wrist/hand    Time 8    Period Weeks    Status New    Target Date 09/29/21      OT LONG TERM GOAL #2   Title Pt wil lbe Mod I scar management as seen by I ability to perform in the clinic    Time 8    Period Weeks    Status New    Target Date 09/29/21      OT LONG TERM GOAL #3   Title Pt will be Mod I ADL's and light functional use of left as non dominant hand as per pt report of ability to cut food, dress, bathe etc w/o assistance or simulation in the clinic    Time 8    Period Weeks    Status New    Target Date 09/29/21      OT LONG TERM GOAL #4   Title Pt will demonstrate improved A/ROM of digits left as seen by ability to oppose thumb to base of small finger and make a flat fist.    Time 8    Period Weeks    Status New    Target Date 09/29/21      OT LONG TERM GOAL #5   Title Pt will demonstrate decreaesd pain in left wrist as seen by pain rating as 2-3/10 or less during functional activity and simulated homemaking tasks    Time 8    Period Weeks    Status New    Target Date 09/29/21                Plan - 08/04/21 1117     Clinical Impression Statement Pt is a pleasant 70 y/o R HD female whom sustained a left intra-articular distal radius fracture when she tripped and fell at home on 07/15/21. She underwent ORIF on 07/20/21 per Dr Tempie Donning and presents today for OT/Eval and treatment as well as splinting per [facility] Protocol. 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 Please refer to her chart for full PMH. She presents today with moderate edema in her L hand/UE, decreased A/ROM fingers, pain in wrist, need for splinting and pt education. She was fitted with a custom fabricated left wrist orthosis that places her wrist in neutral to slight extension and her digits are free for A/ROM. She was instructed in splinting use, care and precautions and will d/c sling use at this time. She was instructed in an intial HEP for active  blocked flexion ex's, tendon gliding, hook, composite and flat fist, edema control techniques (cryotherapy as well as retrograde massage). She should benfit from cont out-pt hand therapy to address these deficits and increase functional use of her left hand as well as eventual strengthening. She will f/u in ~1-2 weeks for upgrade of HEP and splint check and adjustment.    OT Occupational Profile and History Problem Focused Assessment - Including review of records relating to presenting problem    Occupational performance deficits (Please refer to evaluation for details): ADL's    Body Structure / Function / Physical Skills ADL;Strength;Dexterity;Pain;Edema;UE functional use;ROM;Scar mobility;Coordination;Flexibility;Decreased knowledge of precautions;FMC    Rehab Potential Fair    Clinical Decision Making Limited treatment options, no task modification necessary    Comorbidities Affecting Occupational Performance: May have comorbidities impacting occupational performance    Modification or Assistance to Complete Evaluation  No modification of tasks or assist necessary to complete eval    OT Frequency Other (comment)   1-2x/week for 6 weeks   OT Duration Other (comment)   1-2x/week over next 6-8 weeks. May need to be adjusted to  patient schedule and holidays   OT Treatment/Interventions Self-care/ADL training;Fluidtherapy;Splinting;Therapeutic activities;Therapeutic exercise;Scar mobilization;Cryotherapy;Passive range of motion;Manual Therapy;Patient/family education    Plan Splint check and adjustments, review and upgrade HEP as per [place] hand protocol L wrist ORIF.    Consulted and Agree with Plan of Care Patient             Patient will benefit from skilled therapeutic intervention in order to improve the following deficits and impairments:   Body Structure / Function / Physical Skills: ADL, Strength, Dexterity, Pain, Edema, UE functional use, ROM, Scar mobility, Coordination, Flexibility, Decreased knowledge of precautions, FMC    Flexor Tendon Gliding (Active Full Fist)    Straighten all fingers, then make a fist, bending all joints. Repeat __10__ times. Do _4-6___ sessions per day.  Flexor Tendon Gliding (Active Hook Fist)    With fingers and knuckles straight, bend middle and tip joints. Do not bend large knuckles. Repeat __5-10__ times. Do __4-6__ sessions per day.  Flexor Tendon Gliding (Active Straight Fist)    Start with fingers straight. Bend knuckles and middle joints. Keep fingertip joints straight to touch base of palm. Repeat __5-10__ times. Do _4-6___ sessions per day.  DIP Flexion (Active Blocked)    Hold __each finger one at a time_firmly at the middle so that only the tip joint can bend. Hold __5-10__ seconds. Repeat __10__ times. Do _4-6___ sessions per day.  MP Flexion (Active Blocked)    Using other hand to brace base of thumb, bend as far as possible with tip joint held straight. Repeat _10___ times. Do _4-6_ sessions per day.  PIP Flexion (Active Blocked)    Hold large knuckle straight using other hand. Bend middle joint of _each finger one at a time as far as possible. Hold __5-10__ seconds. Repeat __10__ times. Do _4-6__ sessions per day.   MP / PIP / DIP  Composite Flexion (Passive Stretch)    Use other hand to bend _each finger one at a time_at all three joints. Hold __5-10__ seconds. Repeat ___10_ times. Do __4-6__ sessions per day.  WEARING SCHEDULE:  Wear splint at ALL times except for hygiene care (May remove splint for exercises to fingers if you want to and for massage and then place back on ONLY if directed by the therapist)  PURPOSE:  To prevent movement and for protection until injury can heal  CARE OF SPLINT:  Keep splint away from heat sources including: stove, radiator or furnace, or a car in sunlight. The splint can melt and will no longer fit you properly  Keep away from pets and children  Clean the splint with rubbing alcohol as needed..  * During this time, make sure you also clean your hand/arm as instructed by your therapist and/or perform dressing changes as needed. Then dry hand/arm completely before replacing splint. (When cleaning hand/arm, keep it immobilized in same position until splint is replaced)  PRECAUTIONS/POTENTIAL PROBLEMS: *If you notice or experience increased pain, swelling, numbness, or a lingering reddened area from the splint: Contact your therapist immediately by calling [phone]. You must wear the splint for protection, but we will get you scheduled for adjustments as quickly as possible.  (If only straps or hooks need to be replaced and NO adjustments to the splint need to be made, just call the office ahead and let them know you are coming in)  If you have any medical concerns or signs of infection, please call your doctor immediately   Visit Diagnosis: Pain in left wrist - Plan: Ot plan of care cert/re-cert  Pain in left hand - Plan: Ot plan of care cert/re-cert  Generalized edema - Plan: Ot plan of care cert/re-cert  Stiffness of left hand, not elsewhere classified - Plan: Ot plan of care cert/re-cert  Other lack of coordination - Plan: Ot plan of care cert/re-cert  Muscle  weakness (generalized) - Plan: Ot plan of care cert/re-cert    Problem List Patient Active Problem List   Diagnosis Date Noted   Closed fracture of left distal radius 07/16/2021   Cognitive complaints with normal neuropsychological exam 03/18/2021   Encounter for monitoring denosumab therapy 11/02/2017   Bilateral impacted cerumen 10/03/2017   Recurrent UTI 08/31/2017   Encounter for monitoring aromatase inhibitor therapy 12/16/2015   Palpitation 08/18/2015   Encounter for follow-up surveillance of breast cancer 06/09/2015   Abnormal auditory perception 06/27/2014   Atrial premature depolarization 06/27/2014   Atrophic vaginitis 06/27/2014   Chronic cystitis 06/27/2014   Hyperlipemia 06/27/2014   Vitamin D deficiency 06/27/2014   Age-related osteoporosis without current pathological fracture 04/21/2014   GERD (gastroesophageal reflux disease) 04/21/2014   Hypothyroidism 04/21/2014   Malignant neoplasm of upper-outer quadrant of left breast in female, estrogen receptor positive ([place]) 04/21/2014   Osteoporosis 04/21/2014   Primary localized osteoarthrosis of hand 02/23/2013    Almyra Deforest, OT/L 08/04/2021, 11:43 AM  [facility] [address] [place], [place], [address] Phone: [phone]   Fax:  [phone]  Name: Robin York MRN: [id number] Date of Birth: 06-28-1951

## 2021-08-04 NOTE — Patient Instructions (Signed)
Flexor Tendon Gliding (Active Full Fist)    Straighten all fingers, then make a fist, bending all joints. Repeat ____ times. Do ____ sessions per day.  Flexor Tendon Gliding (Active Hook Fist)    With fingers and knuckles straight, bend middle and tip joints. Do not bend large knuckles. Repeat ____ times. Do ____ sessions per day.  Flexor Tendon Gliding (Active Straight Fist)    Start with fingers straight. Bend knuckles and middle joints. Keep fingertip joints straight to touch base of palm. Repeat ____ times. Do ____ sessions per day.  DIP Flexion (Active Blocked)    Hold ______ finger firmly at the middle so that only the tip joint can bend. Hold ____ seconds. Repeat ____ times. Do ____ sessions per day.  MP Flexion (Active Blocked)    Using other hand to brace base of thumb, bend as far as possible with tip joint held straight. Repeat ____ times. Do ____ sessions per day.  PIP Flexion (Active Blocked)    Hold large knuckle straight using other hand. Bend middle joint of ______ finger as far as possible. Hold ____ seconds. Repeat ____ times. Do ____ sessions per day. Activity: Curl fingers around a jar cap.*  MP / PIP / DIP Composite Flexion (Passive Stretch)    Use other hand to bend ______ finger at all three joints. Hold ____ seconds. Repeat ____ times. Do ____ sessions per day.  WEARING SCHEDULE:  Wear splint at ALL times except for hygiene care (May remove splint for exercises to fingers if you want to and for massage and then place back on ONLY if directed by the therapist)  PURPOSE:  To prevent movement and for protection until injury can heal  CARE OF SPLINT:  Keep splint away from heat sources including: stove, radiator or furnace, or a car in sunlight. The splint can melt and will no longer fit you properly  Keep away from pets and children  Clean the splint with rubbing alcohol as needed..  * During this time, make sure you also clean your  hand/arm as instructed by your therapist and/or perform dressing changes as needed. Then dry hand/arm completely before replacing splint. (When cleaning hand/arm, keep it immobilized in same position until splint is replaced)  PRECAUTIONS/POTENTIAL PROBLEMS: *If you notice or experience increased pain, swelling, numbness, or a lingering reddened area from the splint: Contact your therapist immediately by calling [phone]. You must wear the splint for protection, but we will get you scheduled for adjustments as quickly as possible.  (If only straps or hooks need to be replaced and NO adjustments to the splint need to be made, just call the office ahead and let them know you are coming in)  If you have any medical concerns or signs of infection, please call your doctor immediately

## 2021-08-07 ENCOUNTER — Encounter: Payer: Self-pay | Admitting: Occupational Therapy

## 2021-08-09 ENCOUNTER — Telehealth: Payer: Self-pay

## 2021-08-09 NOTE — Telephone Encounter (Signed)
Patient contacted the office this morning stating that she has been having increased swelling in her left wrist. She has ben elevating as instructed to do with no relief. She has been scheduled for 08/10/2021 to be evaluated.

## 2021-08-10 ENCOUNTER — Ambulatory Visit (INDEPENDENT_AMBULATORY_CARE_PROVIDER_SITE_OTHER): Payer: Medicare Other

## 2021-08-10 ENCOUNTER — Other Ambulatory Visit: Payer: Self-pay

## 2021-08-10 ENCOUNTER — Encounter: Payer: Self-pay | Admitting: Occupational Therapy

## 2021-08-10 ENCOUNTER — Ambulatory Visit (INDEPENDENT_AMBULATORY_CARE_PROVIDER_SITE_OTHER): Payer: Medicare Other | Admitting: Orthopedic Surgery

## 2021-08-10 DIAGNOSIS — S52572A Other intraarticular fracture of lower end of left radius, initial encounter for closed fracture: Secondary | ICD-10-CM

## 2021-08-10 MED ORDER — DICLOFENAC SODIUM 75 MG PO TBEC: 75.0000 mg | DELAYED_RELEASE_TABLET | Freq: Two times a day (BID) | ORAL | 0 refills | Status: AC

## 2021-08-10 NOTE — Progress Notes (Signed)
Post-Op Visit Note   Patient: Robin York           Date of Birth: Jan 31, 1951           MRN: [id number] Visit Date: 08/10/2021 PCP: Concepcion Elk, MD   Assessment & Plan:  Chief Complaint:  Chief Complaint  Patient presents with   Left Wrist - Routine Post Op    Concerned about the amount of swelling she is having, thumb is not responding.   Visit Diagnoses:  1. Other closed intra-articular fracture of distal end of left radius, initial encounter     Plan: Patient presents today with swelling of the thumb and dorsal hand.  She has been doing her exercises at home but having difficulty secondary to swelling.  She has no increase in pain.  She is still able to flex and extend her thumb IP joint without pain.  She has been wearing her splint.  Will have her see hand therapy today for edema control and to adjust her splint if necessary.  Will also send prescription for stronger anti-inflammatory.  I can see her back next week as scheduled.   Follow-Up Instructions: No follow-ups on file.   Orders:  Orders Placed This Encounter  Procedures   XR Wrist Complete Left   No orders of the defined types were placed in this encounter.   Imaging: No results found.  PMFS History: Patient Active Problem List   Diagnosis Date Noted   Closed fracture of left distal radius 07/16/2021   Cognitive complaints with normal neuropsychological exam 03/18/2021   Encounter for monitoring denosumab therapy 11/02/2017   Bilateral impacted cerumen 10/03/2017   Recurrent UTI 08/31/2017   Encounter for monitoring aromatase inhibitor therapy 12/16/2015   Palpitation 08/18/2015   Encounter for follow-up surveillance of breast cancer 06/09/2015   Abnormal auditory perception 06/27/2014   Atrial premature depolarization 06/27/2014   Atrophic vaginitis 06/27/2014   Chronic cystitis 06/27/2014   Hyperlipemia 06/27/2014   Vitamin D deficiency 06/27/2014   Age-related osteoporosis without current  pathological fracture 04/21/2014   GERD (gastroesophageal reflux disease) 04/21/2014   Hypothyroidism 04/21/2014   Malignant neoplasm of upper-outer quadrant of left breast in female, estrogen receptor positive ([place]) 04/21/2014   Osteoporosis 04/21/2014   Primary localized osteoarthrosis of hand 02/23/2013   Past Medical History:  Diagnosis Date   Cancer ([place]) 2012   left breast cancer, stage I invasive lobular carcinoma   Frequent UTI    GERD (gastroesophageal reflux disease)    HLD (hyperlipidemia)    Hypothyroidism 04/21/2014   Osteoporosis    PAT (paroxysmal atrial tachycardia) ([facility]CC)    no meds, no current problem   PONV (postoperative nausea and vomiting)     No family history on file.  Past Surgical History:  Procedure Laterality Date   BREAST SURGERY Bilateral 04/2011   bilateral mastectomies for left breast cancer with reconstruction   COLONOSCOPY  07/2020   FINGER SURGERY Left 07/2020   left hand -index finger   LAPAROSCOPIC ASSISTED VAGINAL HYSTERECTOMY     OPEN REDUCTION INTERNAL FIXATION (ORIF) DISTAL RADIAL FRACTURE Left 07/20/2021   Procedure: OPEN REDUCTION INTERNAL FIXATION (ORIF) LEFT DISTAL RADIAL FRACTURE;  Surgeon: Sherilyn Cooter, MD;  Location: [place];  Service: Orthopedics;  Laterality: Left;   Social History   Occupational History   Not on file  Tobacco Use   Smoking status: Never   Smokeless tobacco: Never  Vaping Use   Vaping Use: Never used  Substance and Sexual Activity  Alcohol use: Yes    Alcohol/week: 7.0 standard drinks    Types: 7 Glasses of wine per week   Drug use: No   Sexual activity: Yes    Birth control/protection: None    Comment: Hysterectomy

## 2021-08-17 ENCOUNTER — Other Ambulatory Visit: Payer: Self-pay

## 2021-08-17 ENCOUNTER — Ambulatory Visit (INDEPENDENT_AMBULATORY_CARE_PROVIDER_SITE_OTHER): Payer: Medicare Other | Admitting: Orthopedic Surgery

## 2021-08-17 DIAGNOSIS — S52572A Other intraarticular fracture of lower end of left radius, initial encounter for closed fracture: Secondary | ICD-10-CM

## 2021-08-17 NOTE — Progress Notes (Signed)
Post-Op Visit Note   Patient: Robin York           Date of Birth: 08/12/1951           MRN: [id number] Visit Date: 08/17/2021 PCP: Concepcion Elk, MD   Assessment & Plan:  Chief Complaint:  Chief Complaint  Patient presents with   Left Wrist - Follow-up, Fracture   Visit Diagnoses:  1. Other closed intra-articular fracture of distal end of left radius, initial encounter     Plan: Patient's swelling has improved since out visit last week with the edema glove but still has significant finger swelling.  Having some pain at volar aspect of incision.  No N/T in palm or fingers.  Most pain is at base of thumb with limited thumb ROM.  Discussed that 4 weeks is still early in the grand scheme of distal radius fracture healing. She will see hand therapy tomorrow to check her splint and continue to work on edema control and ROM.  I can see her in another two weeks and will get repeat x-rays at that point.   Follow-Up Instructions: No follow-ups on file.   Orders:  No orders of the defined types were placed in this encounter.  No orders of the defined types were placed in this encounter.   Imaging: No results found.  PMFS History: Patient Active Problem List   Diagnosis Date Noted   Closed fracture of left distal radius 07/16/2021   Cognitive complaints with normal neuropsychological exam 03/18/2021   Encounter for monitoring denosumab therapy 11/02/2017   Bilateral impacted cerumen 10/03/2017   Recurrent UTI 08/31/2017   Encounter for monitoring aromatase inhibitor therapy 12/16/2015   Palpitation 08/18/2015   Encounter for follow-up surveillance of breast cancer 06/09/2015   Abnormal auditory perception 06/27/2014   Atrial premature depolarization 06/27/2014   Atrophic vaginitis 06/27/2014   Chronic cystitis 06/27/2014   Hyperlipemia 06/27/2014   Vitamin D deficiency 06/27/2014   Age-related osteoporosis without current pathological fracture 04/21/2014   GERD  (gastroesophageal reflux disease) 04/21/2014   Hypothyroidism 04/21/2014   Malignant neoplasm of upper-outer quadrant of left breast in female, estrogen receptor positive ([place]) 04/21/2014   Osteoporosis 04/21/2014   Primary localized osteoarthrosis of hand 02/23/2013   Past Medical History:  Diagnosis Date   Cancer ([place]) 2012   left breast cancer, stage I invasive lobular carcinoma   Frequent UTI    GERD (gastroesophageal reflux disease)    HLD (hyperlipidemia)    Hypothyroidism 04/21/2014   Osteoporosis    PAT (paroxysmal atrial tachycardia) (HCC)    no meds, no current problem   PONV (postoperative nausea and vomiting)     No family history on file.  Past Surgical History:  Procedure Laterality Date   BREAST SURGERY Bilateral 04/2011   bilateral mastectomies for left breast cancer with reconstruction   COLONOSCOPY  07/2020   FINGER SURGERY Left 07/2020   left hand -index finger   LAPAROSCOPIC ASSISTED VAGINAL HYSTERECTOMY     OPEN REDUCTION INTERNAL FIXATION (ORIF) DISTAL RADIAL FRACTURE Left 07/20/2021   Procedure: OPEN REDUCTION INTERNAL FIXATION (ORIF) LEFT DISTAL RADIAL FRACTURE;  Surgeon: Sherilyn Cooter, MD;  Location: [facility];  Service: Orthopedics;  Laterality: Left;   Social History   Occupational History   Not on file  Tobacco Use   Smoking status: Never   Smokeless tobacco: Never  Vaping Use   Vaping Use: Never used  Substance and Sexual Activity   Alcohol use: Yes    Alcohol/week: 7.0 standard  drinks    Types: 7 Glasses of wine per week   Drug use: No   Sexual activity: Yes    Birth control/protection: None    Comment: Hysterectomy

## 2021-08-18 ENCOUNTER — Ambulatory Visit (INDEPENDENT_AMBULATORY_CARE_PROVIDER_SITE_OTHER): Payer: Medicare Other | Admitting: Occupational Therapy

## 2021-08-18 ENCOUNTER — Encounter: Payer: Self-pay | Admitting: Occupational Therapy

## 2021-08-18 DIAGNOSIS — M6281 Muscle weakness (generalized): Secondary | ICD-10-CM

## 2021-08-18 DIAGNOSIS — M25642 Stiffness of left hand, not elsewhere classified: Secondary | ICD-10-CM

## 2021-08-18 DIAGNOSIS — R601 Generalized edema: Secondary | ICD-10-CM | POA: Diagnosis not present

## 2021-08-18 DIAGNOSIS — M25532 Pain in left wrist: Secondary | ICD-10-CM | POA: Diagnosis not present

## 2021-08-18 DIAGNOSIS — M79642 Pain in left hand: Secondary | ICD-10-CM | POA: Diagnosis not present

## 2021-08-18 DIAGNOSIS — R278 Other lack of coordination: Secondary | ICD-10-CM

## 2021-08-18 NOTE — Patient Instructions (Addendum)
1)You can remove your splint when at rest at home  2)Remove your splint for all of the following exercises 3)You can wear you compression glove as needed for swelling.  4) You can leave the glove on or remove it during the exercises, it is up to you.  Flexor Tendon Gliding (Active Full Fist)    Straighten all fingers, then make a fist, bending all joints. Repeat 10-15__ times. Do __4-6__ times per day.  Flexor Tendon Gliding (Active Straight Fist)    Start with fingers straight. Bend knuckles and middle joints. Keep fingertip joints straight to touch base of palm. Repeat 10-15 times. Do _4-6___ times per day.  Flexor Tendon Gliding (Active Hook Fist)    With fingers and knuckles straight, bend middle and tip joints. Do not bend large knuckles. Repeat _10-15_ times. Hold fingers in bent position for 5-10 seconds for a good stretch. Do __4-6__ times per day.  Wrist, AROM Flex / Extend    Elbows bent to 90 at sides, palms down. Use armrests on chair if more comfortable. Bend your left wrist downward and hold for 10 seconds. Then bend your left wrist upward and hold for another 10 seconds. Repeat _10__ times per session. Do __4-6_ times per day.  Radial / Ulnar Deviation (Assistive)    With palm and wrist flat on table, slide hand side to side like a windshield wiper. Do not move elbow. Repeat __10__ times. Do __4-6__ sessions per day.  Pronation (Active)    With elbow held at side, wrist straight and palm facing up, turn until palm faces down completely. Hold __10__ seconds. Repeat __10__ times. Do __4-6__ times per day.  Supination (Active)    With elbow held at right angle and kept at side, turn palm upward as far as possible. Hold _10___ seconds. Repeat _10___ times. Do __4-6_ times per day.  Opposition (Active)    Touch tip of thumb to nail tip of each finger in turn, making an "O" shape. Repeat __10__ times each finger. Do __3-4__ times per  day.

## 2021-08-18 NOTE — Therapy (Signed)
[facility] Physical [facility] [address] [place], [place], [address] Phone: [phone]   Fax:  [phone]  Occupational Therapy Treatment  Patient Details  Name: Robin York MRN: [id number] Date of Birth: 1951/07/07 Referring Provider (OT): Dr Tempie Donning   Encounter Date: 08/18/2021   OT End of Session - 08/18/21 1243     Visit Number 2    Number of Visits 12    Date for OT Re-Evaluation 09/01/21   every 10 visit   Authorization Type BC[employer] Medicare    Authorization - Visit Number 2    Progress Note Due on Visit 10    OT Start Time 1055    OT Stop Time 1138    OT Time Calculation (min) 43 min    Activity Tolerance Patient tolerated treatment well    Behavior During Therapy [facility] for tasks assessed/performed             Past Medical History:  Diagnosis Date   Cancer ([place]) 2012   left breast cancer, stage I invasive lobular carcinoma   Frequent UTI    GERD (gastroesophageal reflux disease)    HLD (hyperlipidemia)    Hypothyroidism 04/21/2014   Osteoporosis    PAT (paroxysmal atrial tachycardia) (HCC)    no meds, no current problem   PONV (postoperative nausea and vomiting)     Past Surgical History:  Procedure Laterality Date   BREAST SURGERY Bilateral 04/2011   bilateral mastectomies for left breast cancer with reconstruction   COLONOSCOPY  07/2020   FINGER SURGERY Left 07/2020   left hand -index finger   LAPAROSCOPIC ASSISTED VAGINAL HYSTERECTOMY     OPEN REDUCTION INTERNAL FIXATION (ORIF) DISTAL RADIAL FRACTURE Left 07/20/2021   Procedure: OPEN REDUCTION INTERNAL FIXATION (ORIF) LEFT DISTAL RADIAL FRACTURE;  Surgeon: Sherilyn Cooter, MD;  Location: [facility];  Service: Orthopedics;  Laterality: Left;    There were no vitals filed for this visit.   Subjective Assessment - 08/18/21 1058     Subjective  Pt reports pain her left thumb and stiffness.    Pertinent History Please refer to pt chart for details    Patient Stated Goals Get use  of left UE and hand back    Currently in Pain? Yes    Pain Score 4     Pain Location Arm    Pain Orientation Left    Pain Descriptors / Indicators Aching;Sore    Pain Type Acute pain    Pain Onset 1 to 4 weeks ago    Pain Frequency Rarely    Aggravating Factors  active motion    Pain Relieving Factors Rest, heat    Multiple Pain Sites No                   OT Treatments/Exercises (OP) - 08/18/21 0001       ADLs   ADL Comments Reviewed splinting use, care and precautions and provided pt education to include removing splint 4-6x/day for HEP to hand, wrist and forearm. Pt may also remove the splint for edema control, scar managment and when at rest at home. She verbalized understanding in the clinic after discussion.      Exercises   Exercises Wrist;Hand      Wrist Exercises   Wrist Flexion AROM;Left;10 reps;Seated    Wrist Extension AROM;Left;10 reps;Seated    Wrist Radial Deviation AROM;Left;10 reps;Seated    Wrist Ulnar Deviation AROM;Left;10 reps;Seated    Other wrist exercises Forearm pronation and supination left wrist x10 reps  Other wrist exercises Pt HEP was upgraded to include A/ROM for wrist flexion, extension, RD/UD, pronation/supination, tendon gliding and opposition ex's. Pt was also educated in gentle A/AROM for place and hold in flat and composite fist   Handout was provided and reviewed     Hand Exercises   Other Hand Exercises Verbal review of previously issued HEP for active blocked MP/PIP/DIP flexion and extension, composite, full and hook fisting and opposition ex's. Pt was also educated in passive fist and hold.      Splinting   Splinting Splint was adjusted at dorsal bar through first webspace. This was cut back, smoothed out and gel padding added for increased comfort. Splinting wear and care was reviewed and she verbalized understanding of removing for edema control, HEP and scar management as well as when at rest at home.      Manual Therapy    Manual Therapy Edema management;Passive ROM    Edema Management Verbal review and performance of edema control techniques and instructed in retrograde massage at home for digits, hand, wrist and forearm left. As well as scar management/massage and desensitization. Demonstration was provided and pt verbalized understanding.    Passive ROM Gentle P/ROM left hand/digits for passive fist and hold, flat fist and hold and finger flexion/extension.                    OT Education - 08/18/21 1242     Education Details Begin decreasing Splint at rest and during HEP. Reviewed splint use, care and precautions, upgraded  HEP, edema control and scar management/massage L UE/hand    Person(s) Educated Patient    Methods Explanation;Demonstration;Tactile cues;Verbal cues;Handout    Comprehension Verbalized understanding;Need further instruction              OT Short Term Goals - 08/04/21 1131       OT SHORT TERM GOAL #1   Title Pt will be Mod I splinting use, care and precautions left UE    Time 4    Period Weeks    Status New    Target Date 09/01/21      OT SHORT TERM GOAL #2   Title Pt will be mod I initial HEP for L wrist and hand as seen by mod I ability to perform in the clinic    Time 4    Period Weeks    Status New    Target Date 09/01/21      OT SHORT TERM GOAL #3   Title Pt will be Mod I edema control techniques L UE as seen by pt ability to state 2-3 techniques w/ 1 vc or less    Time 4    Period Weeks    Status New    Target Date 09/01/21               OT Long Term Goals - 08/04/21 1133       OT LONG TERM GOAL #1   Title Pt will be Mod I upgraded HEP L wrist/hand    Time 8    Period Weeks    Status New    Target Date 09/29/21      OT LONG TERM GOAL #2   Title Pt wil lbe Mod I scar management as seen by I ability to perform in the clinic    Time 8    Period Weeks    Status New    Target Date 09/29/21      OT LONG  TERM GOAL #3   Title Pt will be  Mod I ADL's and light functional use of left as non dominant hand as per pt report of ability to cut food, dress, bathe etc w/o assistance or simulation in the clinic    Time 8    Period Weeks    Status New    Target Date 09/29/21      OT LONG TERM GOAL #4   Title Pt will demonstrate improved A/ROM of digits left as seen by ability to oppose thumb to base of small finger and make a flat fist.    Time 8    Period Weeks    Status New    Target Date 09/29/21      OT LONG TERM GOAL #5   Title Pt will demonstrate decreaesd pain in left wrist as seen by pain rating as 2-3/10 or less during functional activity and simulated homemaking tasks    Time 8    Period Weeks    Status New    Target Date 09/29/21                   Plan - 08/18/21 1243     Clinical Impression Statement On 08/10/21, pt had an appointment with Dr Tempie Donning at which time she was with noted edema in her left hand and her splint needed additional padding to the ulnar side of her wrist to bring her wrist into a more neutral position. Pt is noted to have significant edema and pain at her thumb secondary to arthritis as well. Pt was issued an edema glove at that time and edema control techniques were reviewed in the clinic per Dr Tempie Donning request. Pt presents today for her first regularly scheduled f/u appointment . Her HEP was upgraded to include removing her splint during the day for wrist A/ROM and when at rest. She was educated in scar management/massage, and she reports decreased pain and edema as compared to 08/10/21. She should benefit from continued out-pt OT to address deficits in ROM, pain, HEP and need for pt education. Minor splint adjustments today should allow for increased comfort.    OT Occupational Profile and History Problem Focused Assessment - Including review of records relating to presenting problem    Occupational performance deficits (Please refer to evaluation for details): ADL's    Body Structure /  Function / Physical Skills ADL;Strength;Dexterity;Pain;Edema;UE functional use;ROM;Scar mobility;Coordination;Flexibility;Decreased knowledge of precautions;FMC    Rehab Potential Fair    Clinical Decision Making Limited treatment options, no task modification necessary    Comorbidities Affecting Occupational Performance: May have comorbidities impacting occupational performance    Modification or Assistance to Complete Evaluation  No modification of tasks or assist necessary to complete eval    OT Frequency Other (comment)   1-2x/week x6-8 weeks   OT Duration Other (comment)   1-2x/week over next 6-8 weeks   OT Treatment/Interventions Self-care/ADL training;Fluidtherapy;Splinting;Therapeutic activities;Therapeutic exercise;Scar mobilization;Cryotherapy;Passive range of motion;Manual Therapy;Patient/family education    Plan Splint check and adjustments, review and upgrade HEP as per [place] hand protocol L wrist ORIF.    Consulted and Agree with Plan of Care Patient             Patient will benefit from skilled therapeutic intervention in order to improve the following deficits and impairments:   Body Structure / Function / Physical Skills: ADL, Strength, Dexterity, Pain, Edema, UE functional use, ROM, Scar mobility, Coordination, Flexibility, Decreased knowledge of precautions, [facility]  Visit Diagnosis: Pain in left wrist  Pain in left hand  Generalized edema  Stiffness of left hand, not elsewhere classified  Other lack of coordination  Muscle weakness (generalized)  1)You can remove your splint when at rest at home  2)Remove your splint for all of the following exercises 3)You can wear you compression glove as needed for swelling.  4) You can leave the glove on or remove it during the exercises, it is up to you.  Flexor Tendon Gliding (Active Full Fist)  Straighten all fingers, then make a fist, bending all joints. Repeat 10-15__ times. Do __4-6__ times per  day.  Flexor Tendon Gliding (Active Straight Fist)  Start with fingers straight. Bend knuckles and middle joints. Keep fingertip joints straight to touch base of palm. Repeat 10-15 times. Do _4-6___ times per day.  Flexor Tendon Gliding (Active Hook Fist)  With fingers and knuckles straight, bend middle and tip joints. Do not bend large knuckles. Repeat _10-15_ times. Hold fingers in bent position for 5-10 seconds for a good stretch. Do __4-6__ times per day.  Wrist, AROM Flex / Extend  Elbows bent to 90 at sides, palms down. Use armrests on chair if more comfortable. Bend your left wrist downward and hold for 10 seconds. Then bend your left wrist upward and hold for another 10 seconds. Repeat _10__ times per session. Do __4-6_ times per day.  Radial / Ulnar Deviation (Assistive)  With palm and wrist flat on table, slide hand side to side like a windshield wiper. Do not move elbow. Repeat __10__ times. Do __4-6__ sessions per day.  Pronation (Active)  With elbow held at side, wrist straight and palm facing up, turn until palm faces down completely. Hold __10__ seconds. Repeat __10__ times. Do __4-6__ times per day.  Supination (Active)  With elbow held at right angle and kept at side, turn palm upward as far as possible. Hold _10___ seconds. Repeat _10___ times. Do __4-6_ times per day.  Opposition (Active)  Touch tip of thumb to nail tip of each finger in turn, making an "O" shape. Repeat __10__ times each finger. Do __3-4__ times per day.   Problem List Patient Active Problem List   Diagnosis Date Noted   Closed fracture of left distal radius 07/16/2021   Cognitive complaints with normal neuropsychological exam 03/18/2021   Encounter for monitoring denosumab therapy 11/02/2017   Bilateral impacted cerumen 10/03/2017   Recurrent UTI 08/31/2017   Encounter for monitoring aromatase inhibitor therapy 12/16/2015   Palpitation 08/18/2015   Encounter for follow-up  surveillance of breast cancer 06/09/2015   Abnormal auditory perception 06/27/2014   Atrial premature depolarization 06/27/2014   Atrophic vaginitis 06/27/2014   Chronic cystitis 06/27/2014   Hyperlipemia 06/27/2014   Vitamin D deficiency 06/27/2014   Age-related osteoporosis without current pathological fracture 04/21/2014   GERD (gastroesophageal reflux disease) 04/21/2014   Hypothyroidism 04/21/2014   Malignant neoplasm of upper-outer quadrant of left breast in female, estrogen receptor positive ([place]) 04/21/2014   Osteoporosis 04/21/2014   Primary localized osteoarthrosis of hand 02/23/2013    Almyra Deforest, OT/L 08/18/2021, 12:55 PM  [facility] Physical Therapy [address] [place], [place], [address] Phone: [phone]   Fax:  [phone]  Name: Robin York MRN: [id number] Date of Birth: 12-Nov-1950

## 2021-08-23 ENCOUNTER — Encounter: Payer: Self-pay | Admitting: Occupational Therapy

## 2021-08-24 ENCOUNTER — Encounter: Payer: Medicare Other | Admitting: Occupational Therapy

## 2021-08-31 ENCOUNTER — Ambulatory Visit (INDEPENDENT_AMBULATORY_CARE_PROVIDER_SITE_OTHER): Payer: Medicare Other | Admitting: Orthopedic Surgery

## 2021-08-31 ENCOUNTER — Ambulatory Visit: Payer: Medicare Other | Admitting: Occupational Therapy

## 2021-08-31 ENCOUNTER — Other Ambulatory Visit: Payer: Self-pay

## 2021-08-31 ENCOUNTER — Ambulatory Visit (INDEPENDENT_AMBULATORY_CARE_PROVIDER_SITE_OTHER): Payer: Medicare Other

## 2021-08-31 ENCOUNTER — Encounter: Payer: Self-pay | Admitting: Occupational Therapy

## 2021-08-31 VITALS — BP 132/81 | HR 73

## 2021-08-31 DIAGNOSIS — S52572A Other intraarticular fracture of lower end of left radius, initial encounter for closed fracture: Secondary | ICD-10-CM

## 2021-08-31 DIAGNOSIS — R601 Generalized edema: Secondary | ICD-10-CM | POA: Diagnosis not present

## 2021-08-31 DIAGNOSIS — M25642 Stiffness of left hand, not elsewhere classified: Secondary | ICD-10-CM

## 2021-08-31 DIAGNOSIS — R278 Other lack of coordination: Secondary | ICD-10-CM

## 2021-08-31 DIAGNOSIS — M79642 Pain in left hand: Secondary | ICD-10-CM

## 2021-08-31 DIAGNOSIS — M6281 Muscle weakness (generalized): Secondary | ICD-10-CM

## 2021-08-31 DIAGNOSIS — M25532 Pain in left wrist: Secondary | ICD-10-CM

## 2021-08-31 NOTE — Therapy (Signed)
[facility] Physical Therapy [address] [place], [place], [address] Phone: [phone]   Fax:  [phone]  Occupational Therapy Treatment  Patient Details  Name: Robin York MRN: [id number] Date of Birth: 1951/06/29 Referring Provider (OT): Dr Tempie Donning   Encounter Date: 08/31/2021   OT End of Session - 08/31/21 1216     Visit Number 3    Number of Visits 12    Date for OT Re-Evaluation 09/28/21    Authorization Type [employer] Medicare    Authorization - Visit Number 3    Progress Note Due on Visit 10    OT Start Time 1017    OT Stop Time 1100    OT Time Calculation (min) 43 min    Activity Tolerance Patient tolerated treatment well    Behavior During Therapy [facility] for tasks assessed/performed             Past Medical History:  Diagnosis Date   Cancer ([place]) 2012   left breast cancer, stage I invasive lobular carcinoma   Frequent UTI    GERD (gastroesophageal reflux disease)    HLD (hyperlipidemia)    Hypothyroidism 04/21/2014   Osteoporosis    PAT (paroxysmal atrial tachycardia) (HCC)    no meds, no current problem   PONV (postoperative nausea and vomiting)     Past Surgical History:  Procedure Laterality Date   BREAST SURGERY Bilateral 04/2011   bilateral mastectomies for left breast cancer with reconstruction   COLONOSCOPY  07/2020   FINGER SURGERY Left 07/2020   left hand -index finger   LAPAROSCOPIC ASSISTED VAGINAL HYSTERECTOMY     OPEN REDUCTION INTERNAL FIXATION (ORIF) DISTAL RADIAL FRACTURE Left 07/20/2021   Procedure: OPEN REDUCTION INTERNAL FIXATION (ORIF) LEFT DISTAL RADIAL FRACTURE;  Surgeon: Sherilyn Cooter, MD;  Location: [place];  Service: Orthopedics;  Laterality: Left;    There were no vitals filed for this visit.   Subjective Assessment - 08/31/21 1019     Subjective  Pt reports stiffness in left wrist/hand after having flu last week. Pt reports decreased pain overall.    Pertinent History Please refer to pt chart for  details    Patient Stated Goals Get use of left UE and hand back    Currently in Pain? Yes    Pain Score 2     Pain Location Wrist    Pain Orientation Left    Pain Descriptors / Indicators Aching    Pain Type Acute pain    Pain Onset More than a month ago    Pain Frequency Intermittent    Multiple Pain Sites No                OT Treatments/Exercises (OP) - 08/31/21 0001       ADLs   ADL Comments Reviewed splinting use, care and precautions and provided pt education to include removing splint more during the day for HEP to hand, wrist and forearm. Pt may also remove the splint for edema control, scar managment, during light ADL's and light functional activity and when at rest at home. She verbalized understanding in the clinic after discussion. Pt was encouraged to remove in the shower, use hand to do her hair, fold laundry, wash dishes etc... Pt is with increased stiffness/edema since her last visit as she was sick with the flu last week.      Exercises   Exercises Wrist;Hand      Wrist Exercises   Wrist Flexion AROM;Left;10 reps;Seated    Wrist Radial Deviation AROM;Left;10  reps;Seated    Wrist Ulnar Deviation AROM;Left;10 reps;Seated    Other wrist exercises Forearm pronation and supination left wrist x10 reps    Other wrist exercises Added wrist flexion on forearm plinth with 1# weight. Also added A/AROM and gentle passive forearm supination. Plan is to upgrade to hammer when active forearm supination improves. Review and performance of HEP:  to include A/ROM for wrist flexion, extension, RD/UD, Opposition ex's, pt able to oppose only to radial side of IF "since I had the flu last week". Pt was also educated in gentle A/AROM for place and hold in flat fist and composite fist.      Hand Exercises   Other Hand Exercises Verbal review of previously issued HEP for active blocked MP/PIP/DIP flexion and extension, composite, full and hook fisting and opposition ex's, passive fist  and hold.      Splinting   Splinting Pt education to continue decreasing splint use L UE during ADL (bathing, dressing, HEP, doing laundry, washing dishes and any light activity at home etc). Pt continues to wear her edema glove for comfort.      Manual Therapy   Manual Therapy Edema management;Passive ROM    Edema Management Verbal review and performance of edema control techniques and instructed in retrograde massage at home for digits, hand, wrist and forearm left. As well as scar management/massage and desensitization. Demonstration was provided and pt verbalized understanding.    Passive ROM Gentle P/ROM left hand/digits for passive fist and hold, flat fist and hold and finger flexion/extension.                OT Education - 08/31/21 1215     Education Details Continue decreasing splint at rest, during HEP, with light functional activity and ADL's, for edema control and scar management/massage L UE/hand.    Person(s) Educated Patient    Methods Explanation;Demonstration;Tactile cues;Verbal cues    Comprehension Verbalized understanding;Need further instruction              OT Short Term Goals - 08/31/21 1228       OT SHORT TERM GOAL #1   Title Pt will be Mod I splinting use, care and precautions left UE    Time 4    Period Weeks    Status Achieved    Target Date 09/01/21      OT SHORT TERM GOAL #2   Title Pt will be mod I initial HEP for L wrist and hand as seen by mod I ability to perform in the clinic    Time 4    Period Weeks    Status Achieved    Target Date 09/01/21      OT SHORT TERM GOAL #3   Title Pt will be Mod I edema control techniques L UE as seen by pt ability to state 2-3 techniques w/ 1 vc or less    Time 4    Period Weeks    Status Achieved    Target Date 09/01/21               OT Long Term Goals - 08/04/21 1133       OT LONG TERM GOAL #1   Title Pt will be Mod I upgraded HEP L wrist/hand    Time 8    Period Weeks    Status New     Target Date 09/29/21      OT LONG TERM GOAL #2   Title Pt wil lbe Mod I scar management  as seen by I ability to perform in the clinic    Time 8    Period Weeks    Status New    Target Date 09/29/21      OT LONG TERM GOAL #3   Title Pt will be Mod I ADL's and light functional use of left as non dominant hand as per pt report of ability to cut food, dress, bathe etc w/o assistance or simulation in the clinic    Time 8    Period Weeks    Status New    Target Date 09/29/21      OT LONG TERM GOAL #4   Title Pt will demonstrate improved A/ROM of digits left as seen by ability to oppose thumb to base of small finger and make a flat fist.    Time 8    Period Weeks    Status New    Target Date 09/29/21      OT LONG TERM GOAL #5   Title Pt will demonstrate decreaesd pain in left wrist as seen by pain rating as 2-3/10 or less during functional activity and simulated homemaking tasks    Time 8    Period Weeks    Status New    Target Date 09/29/21                   Plan - 08/31/21 1217     Clinical Impression Statement Pt is currently 6 weeks post-op left ORIF DR fracture (DOS: 07/20/21). She has met STG's, presents with decreased pain in her wrist but has increased stiffness and decreased A/ROM since missing her appointment last week due to having the flu. She states that she will miss next week as well secondary to the holidays. She will likely be 8 weeks post-op at her next visit and can be progressed accordingly as per protocol. Pt was encouraged to perform ex's more frequently during the day with focus on HEP as upgraded today. She will continue to remove her splint more during the day as well for ADL's and light functional activity as well as during her ex's, edema control etc. Pt may remove splint when bathing, dressing, folding laundry, doing her hair, washing dishes, sleeping etc. She should get improved ROM by doing this.    OT Occupational Profile and History Problem  Focused Assessment - Including review of records relating to presenting problem    Occupational performance deficits (Please refer to evaluation for details): ADL's    Body Structure / Function / Physical Skills ADL;Strength;Dexterity;Pain;Edema;UE functional use;ROM;Scar mobility;Coordination;Flexibility;Decreased knowledge of precautions;FMC    Rehab Potential Fair    Clinical Decision Making Limited treatment options, no task modification necessary    Comorbidities Affecting Occupational Performance: May have comorbidities impacting occupational performance    Modification or Assistance to Complete Evaluation  No modification of tasks or assist necessary to complete eval    OT Frequency Other (comment)   1-2x/week x6-8 weeks   OT Duration Other (comment)   1-2x/week over next 6-8 weeks   OT Treatment/Interventions Self-care/ADL training;Fluidtherapy;Splinting;Therapeutic activities;Therapeutic exercise;Scar mobilization;Cryotherapy;Passive range of motion;Manual Therapy;Patient/family education    Plan D/C splint at 8 weeks post-op, ROM (A/AROM and P/ROM), begin/continue strengthening with putty, hammer for pro/sup and wrist flexion/extension w/ 1-4# weight as tolerated, as well as UBE level 1 for 6-48mn rotating forward and back. Monitor thumb CMC secondary to h/o arthritis.    Consulted and Agree with Plan of Care Patient  Patient will benefit from skilled therapeutic intervention in order to improve the following deficits and impairments:   Body Structure / Function / Physical Skills: ADL, Strength, Dexterity, Pain, Edema, UE functional use, ROM, Scar mobility, Coordination, Flexibility, Decreased knowledge of precautions, [facility]       Visit Diagnosis: Pain in left wrist  Pain in left hand  Generalized edema  Stiffness of left hand, not elsewhere classified  Other lack of coordination  Muscle weakness (generalized)    Problem List Patient Active Problem List    Diagnosis Date Noted   Closed fracture of left distal radius 07/16/2021   Cognitive complaints with normal neuropsychological exam 03/18/2021   Encounter for monitoring denosumab therapy 11/02/2017   Bilateral impacted cerumen 10/03/2017   Recurrent UTI 08/31/2017   Encounter for monitoring aromatase inhibitor therapy 12/16/2015   Palpitation 08/18/2015   Encounter for follow-up surveillance of breast cancer 06/09/2015   Abnormal auditory perception 06/27/2014   Atrial premature depolarization 06/27/2014   Atrophic vaginitis 06/27/2014   Chronic cystitis 06/27/2014   Hyperlipemia 06/27/2014   Vitamin D deficiency 06/27/2014   Age-related osteoporosis without current pathological fracture 04/21/2014   GERD (gastroesophageal reflux disease) 04/21/2014   Hypothyroidism 04/21/2014   Malignant neoplasm of upper-outer quadrant of left breast in female, estrogen receptor positive ([place]) 04/21/2014   Osteoporosis 04/21/2014   Primary localized osteoarthrosis of hand 02/23/2013    Almyra Deforest, OT 08/31/2021, 12:31 PM  [facility] Physical Therapy [address] [place], [place], [address] Phone: [phone]   Fax:  [phone]  Name: Robin York MRN: [id number] Date of Birth: 06-21-1951

## 2021-08-31 NOTE — Progress Notes (Signed)
Post-Op Visit Note   Patient: Robin York           Date of Birth: 1951-08-22           MRN: [id number] Visit Date: 08/31/2021 PCP: Concepcion Elk, MD   Assessment & Plan:  Chief Complaint:  Chief Complaint  Patient presents with   Left Wrist - Routine Post Op   Visit Diagnoses:  1. Other closed intra-articular fracture of distal end of left radius, initial encounter     Plan: Reviewed her x-rays which show maintained fracture alignment with interval healing.  The plate remains prominent volarly.  She has improved ROM of her thumb IP joint without paint.  Her fingers are quite stiff despite therapy.  She has full and painless pronation but has stiffness with supination.  She has no pain w/ PROM of the wrist.  We again reviewed the importance of therapy.  We also discussed plate removal if she develops volar pain with ROM of her fingers and thumb. She will continue therapy and work on home exercises.  I'll see her back in 3 weeks after the new year with repeat x-rays.   Follow-Up Instructions: No follow-ups on file.   Orders:  Orders Placed This Encounter  Procedures   XR Wrist Complete Left   No orders of the defined types were placed in this encounter.   Imaging: No results found.  PMFS History: Patient Active Problem List   Diagnosis Date Noted   Closed fracture of left distal radius 07/16/2021   Cognitive complaints with normal neuropsychological exam 03/18/2021   Encounter for monitoring denosumab therapy 11/02/2017   Bilateral impacted cerumen 10/03/2017   Recurrent UTI 08/31/2017   Encounter for monitoring aromatase inhibitor therapy 12/16/2015   Palpitation 08/18/2015   Encounter for follow-up surveillance of breast cancer 06/09/2015   Abnormal auditory perception 06/27/2014   Atrial premature depolarization 06/27/2014   Atrophic vaginitis 06/27/2014   Chronic cystitis 06/27/2014   Hyperlipemia 06/27/2014   Vitamin D deficiency 06/27/2014   Age-related  osteoporosis without current pathological fracture 04/21/2014   GERD (gastroesophageal reflux disease) 04/21/2014   Hypothyroidism 04/21/2014   Malignant neoplasm of upper-outer quadrant of left breast in female, estrogen receptor positive ([place]) 04/21/2014   Osteoporosis 04/21/2014   Primary localized osteoarthrosis of hand 02/23/2013   Past Medical History:  Diagnosis Date   Cancer ([place]) 2012   left breast cancer, stage I invasive lobular carcinoma   Frequent UTI    GERD (gastroesophageal reflux disease)    HLD (hyperlipidemia)    Hypothyroidism 04/21/2014   Osteoporosis    PAT (paroxysmal atrial tachycardia) (HCC)    no meds, no current problem   PONV (postoperative nausea and vomiting)     No family history on file.  Past Surgical History:  Procedure Laterality Date   BREAST SURGERY Bilateral 04/2011   bilateral mastectomies for left breast cancer with reconstruction   COLONOSCOPY  07/2020   FINGER SURGERY Left 07/2020   left hand -index finger   LAPAROSCOPIC ASSISTED VAGINAL HYSTERECTOMY     OPEN REDUCTION INTERNAL FIXATION (ORIF) DISTAL RADIAL FRACTURE Left 07/20/2021   Procedure: OPEN REDUCTION INTERNAL FIXATION (ORIF) LEFT DISTAL RADIAL FRACTURE;  Surgeon: Sherilyn Cooter, MD;  Location: [place];  Service: Orthopedics;  Laterality: Left;   Social History   Occupational History   Not on file  Tobacco Use   Smoking status: Never   Smokeless tobacco: Never  Vaping Use   Vaping Use: Never used  Substance and Sexual  Activity   Alcohol use: Yes    Alcohol/week: 7.0 standard drinks    Types: 7 Glasses of wine per week   Drug use: No   Sexual activity: Yes    Birth control/protection: None    Comment: Hysterectomy

## 2021-09-15 ENCOUNTER — Ambulatory Visit: Payer: Medicare Other | Admitting: Urology

## 2021-09-15 ENCOUNTER — Other Ambulatory Visit: Payer: Self-pay

## 2021-09-15 ENCOUNTER — Encounter: Payer: Self-pay | Admitting: Urology

## 2021-09-15 VITALS — BP 161/70 | HR 76 | Wt 125.0 lb

## 2021-09-15 DIAGNOSIS — R8290 Unspecified abnormal findings in urine: Secondary | ICD-10-CM | POA: Diagnosis not present

## 2021-09-15 DIAGNOSIS — N390 Urinary tract infection, site not specified: Secondary | ICD-10-CM | POA: Diagnosis not present

## 2021-09-15 LAB — URINALYSIS, ROUTINE W REFLEX MICROSCOPIC
Bilirubin, UA: NEGATIVE
Glucose, UA: NEGATIVE
Ketones, UA: NEGATIVE
Nitrite, UA: NEGATIVE
Protein,UA: NEGATIVE
RBC, UA: NEGATIVE
Specific Gravity, UA: 1.015 (ref 1.005–1.030)
Urobilinogen, Ur: 0.2 mg/dL (ref 0.2–1.0)
pH, UA: 6 (ref 5.0–7.5)

## 2021-09-15 LAB — MICROSCOPIC EXAMINATION: RBC, Urine: NONE SEEN /hpf (ref 0–2)

## 2021-09-15 MED ORDER — CIPROFLOXACIN HCL 500 MG PO TABS: 500.0000 mg | ORAL_TABLET | Freq: Two times a day (BID) | ORAL | 0 refills | Status: AC

## 2021-09-15 NOTE — Progress Notes (Signed)

## 2021-09-15 NOTE — Progress Notes (Signed)
Assessment: 1. Frequent UTI   2. Abnormal urine findings     Plan: I reviewed the patient's records from [facility] including office notes and lab results. Urine culture today Will call with results Rx for Cipro 500 mg BID x 5 days provided for prn use Continue methods to reduce the risk of UTIs including increased fluid intake, timed and double voiding, daily cranberry supplement, daily probiotics. Return to office in 3-4 months.   Chief Complaint: Chief Complaint  Patient presents with   Recurrent UTI    HPI: Robin York is a 70 y.o. female who presents for continued evaluation of recurrent UTIs.  She was previously followed in [facility]. Urologic History: She has a history of frequent UTI's with 3-4 UTI's per year. Typical symptoms include bladder pressure, frequency. No dysuria, flank pain, or gross hematuria. .  Urine culture from 12/18 showed no growth. She was started on daily TMP. No UTI symptoms while on daily TMP.  She discontinued the medication in May 2019.  She had UTI symptoms in August 2019, resolved with doxycycline.  Urine culture from 3/20 grew 50-100K Burkholderia. Treated with Septra DS. Urine culture from 6/20 showed no growth. She was treated with Septra x5 days for UTI symptoms in July 2020. Her symptoms resolved following antibiotic therapy. She was seen in 8/20 with a several week history of worsening bladder pressure and frequency. No dysuria or gross hematuria. No low back pain.  Urine culture 8/20: >100K Enterococcus. Treated with Augmentin and started on daily amoxicillin. Urine culture from 9/20: No growth Urine culture from 4/21: >100K Enterococcus. Treated with Augmentin. She was again treated with Augmentin in May 2021. She reported resolution of her UTI symptoms for approximately 7-10 days after completing antibiotics. She discontinued the daily antibiotic last fall. She was seen in June 2021 for a recurrence of her UTI symptoms  with frequency, urgency, nocturia, and bladder pressure. No fever, chills, flank pain, or gross hematuria. Urine culture grew >100K Pseudomonas. She was treated with cefdinir. She was then started on daily Keflex for UTI prophylaxis. She had recurrence of her symptoms in late July 2021. Urine culture grew >100 K Pseudomonas. She was treated with Cipro x 5 days. She resumed the daily Keflex.  At the time of her last visit in June 2022, she had discontinued the daily Keflex approximately 10 days prior.  She was not having any dysuria or gross hematuria. She had noted a slight increase in her nocturia and urgency. Urine culture from 6/22 grew 50-100 K Pseudomonas.  She was treated with Cipro and restarted on daily cephalexin.  She presents today for follow-up.  She was on the daily cephalexin until approximately 1 month ago.  She reports treatment for a presumed UTI in October 2022.  She was seen in urgent care.  Urine culture grew <10K mixed flora.  She is not having any UTI symptoms today.  No dysuria or gross hematuria.  She continues with nocturia.  Portions of the above documentation were copied from a prior visit for review purposes only.  Allergies: Allergies  Allergen Reactions   Nitrofurantoin Nausea And Vomiting    PMH: Past Medical History:  Diagnosis Date   Cancer ([place]) 2012   left breast cancer, stage I invasive lobular carcinoma   Frequent UTI    GERD (gastroesophageal reflux disease)    HLD (hyperlipidemia)    Hypothyroidism 04/21/2014   Osteoporosis    PAT (paroxysmal atrial tachycardia) (HCC)    no  meds, no current problem   PONV (postoperative nausea and vomiting)     PSH: Past Surgical History:  Procedure Laterality Date   BREAST SURGERY Bilateral 04/2011   bilateral mastectomies for left breast cancer with reconstruction   COLONOSCOPY  07/2020   FINGER SURGERY Left 07/2020   left hand -index finger   LAPAROSCOPIC ASSISTED VAGINAL HYSTERECTOMY     OPEN  REDUCTION INTERNAL FIXATION (ORIF) DISTAL RADIAL FRACTURE Left 07/20/2021   Procedure: OPEN REDUCTION INTERNAL FIXATION (ORIF) LEFT DISTAL RADIAL FRACTURE;  Surgeon: Sherilyn Cooter, MD;  Location: [place];  Service: Orthopedics;  Laterality: Left;    SH: Social History   Tobacco Use   Smoking status: Never   Smokeless tobacco: Never  Vaping Use   Vaping Use: Never used  Substance Use Topics   Alcohol use: Yes    Alcohol/week: 7.0 standard drinks    Types: 7 Glasses of wine per week   Drug use: No    ROS: Constitutional:  Negative for fever, chills, weight loss CV: Negative for chest pain, previous MI, hypertension Respiratory:  Negative for shortness of breath, wheezing, sleep apnea, frequent cough GI:  Negative for nausea, vomiting, bloody stool, GERD  PE: BP (!) 161/70    Pulse 76    Wt 125 lb (56.7 kg)    BMI 20.18 kg/m  GENERAL APPEARANCE:  Well appearing, well developed, well nourished, NAD HEENT:  Atraumatic, normocephalic, oropharynx clear NECK:  Supple without lymphadenopathy or thyromegaly ABDOMEN:  Soft, non-tender, no masses EXTREMITIES:  Moves all extremities well, without clubbing, cyanosis, or edema NEUROLOGIC:  Alert and oriented x 3, normal gait, CN II-XII grossly intact MENTAL STATUS:  appropriate BACK:  Non-tender to palpation, No CVAT SKIN:  Warm, dry, and intact   Results: U/A:  11-30 WBC, mod bacteria, nitrite negative

## 2021-09-16 ENCOUNTER — Ambulatory Visit (INDEPENDENT_AMBULATORY_CARE_PROVIDER_SITE_OTHER): Payer: Medicare Other | Admitting: Occupational Therapy

## 2021-09-16 DIAGNOSIS — R601 Generalized edema: Secondary | ICD-10-CM

## 2021-09-16 DIAGNOSIS — M79642 Pain in left hand: Secondary | ICD-10-CM

## 2021-09-16 DIAGNOSIS — M6281 Muscle weakness (generalized): Secondary | ICD-10-CM

## 2021-09-16 DIAGNOSIS — M25532 Pain in left wrist: Secondary | ICD-10-CM

## 2021-09-16 DIAGNOSIS — M25642 Stiffness of left hand, not elsewhere classified: Secondary | ICD-10-CM | POA: Diagnosis not present

## 2021-09-16 NOTE — Therapy (Signed)
[facility] [address] [place], [place], [address] Phone: [phone]   Fax:  [phone]  Occupational Therapy Treatment  Patient Details  Name: Robin York MRN: [id number] Date of Birth: [birth year] Referring Provider (OT): Dr Tempie Donning   Encounter Date: 09/16/2021   OT End of Session - 09/16/21 1103     Visit Number 4    Number of Visits 12    Date for OT Re-Evaluation 09/28/21    Authorization Type BCBS Medicare    Authorization - Visit Number 4    Progress Note Due on Visit 10    OT Start Time 1020    OT Stop Time 1100    OT Time Calculation (min) 40 min    Activity Tolerance Patient tolerated treatment well    Behavior During Therapy [facility] for tasks assessed/performed             Past Medical History:  Diagnosis Date   Cancer ([place]) 2012   left breast cancer, stage I invasive lobular carcinoma   Frequent UTI    GERD (gastroesophageal reflux disease)    HLD (hyperlipidemia)    Hypothyroidism 04/21/2014   Osteoporosis    PAT (paroxysmal atrial tachycardia) (HCC)    no meds, no current problem   PONV (postoperative nausea and vomiting)     Past Surgical History:  Procedure Laterality Date   BREAST SURGERY Bilateral 04/2011   bilateral mastectomies for left breast cancer with reconstruction   COLONOSCOPY  07/2020   FINGER SURGERY Left 07/2020   left hand -index finger   LAPAROSCOPIC ASSISTED VAGINAL HYSTERECTOMY     OPEN REDUCTION INTERNAL FIXATION (ORIF) DISTAL RADIAL FRACTURE Left 07/20/2021   Procedure: OPEN REDUCTION INTERNAL FIXATION (ORIF) LEFT DISTAL RADIAL FRACTURE;  Surgeon: Sherilyn Cooter, MD;  Location: [place];  Service: Orthopedics;  Laterality: Left;    There were no vitals filed for this visit.   Subjective Assessment - 09/16/21 1110     Subjective  Pt reports continued stiffness Lt wrist and thumb difficulty, but reports supination has improved    Pertinent History Please refer to pt chart for  details    Patient Stated Goals Get use of left UE and hand back    Currently in Pain? Yes    Pain Score 2     Pain Location Wrist    Pain Orientation Left    Pain Descriptors / Indicators Aching    Pain Type Acute pain    Pain Onset More than a month ago    Pain Frequency Intermittent    Aggravating Factors  movement    Pain Relieving Factors rest, heat                [facility] OT Assessment - 09/16/21 0001       AROM   Overall AROM Comments Lt wrist flex = 35*, ext = 20*      Hand Function   Right Hand Grip (lbs) 52 lbs    Left Hand Grip (lbs) 7.6 lbs             Pt requested new compression glove d/t previous one stretched out. Pt issued new glove.   Pt issued updated HEP for wrist P/ROM, forearm light strengthening/weighted stretch, and putty HEP for grip strength (issued yellow putty) - see pt instructions for details.   Pt very stiff at wrist, especially with extension (see above ROM), but supination appears to have improved via observation.   Pt instructed to monitor thumb and continue to  work on palmer abduction and opposition. Pt also instructed to support base of thumb with pinch strengthening ex (yellow putty)                  OT Education - 09/16/21 1049     Education Details P/ROM HEP for wrist, light strengthening for forearm rotation and hand strengthening w/ putty    Person(s) Educated Patient    Methods Explanation;Demonstration;Verbal cues;Handout    Comprehension Verbalized understanding;Returned demonstration;Verbal cues required              OT Short Term Goals - 08/31/21 1228       OT SHORT TERM GOAL #1   Title Pt will be Mod I splinting use, care and precautions left UE    Time 4    Period Weeks    Status Achieved    Target Date 09/01/21      OT SHORT TERM GOAL #2   Title Pt will be mod I initial HEP for L wrist and hand as seen by mod I ability to perform in the clinic    Time 4    Period Weeks    Status Achieved     Target Date 09/01/21      OT SHORT TERM GOAL #3   Title Pt will be Mod I edema control techniques L UE as seen by pt ability to state 2-3 techniques w/ 1 vc or less    Time 4    Period Weeks    Status Achieved    Target Date 09/01/21               OT Long Term Goals - 08/04/21 1133       OT LONG TERM GOAL #1   Title Pt will be Mod I upgraded HEP L wrist/hand    Time 8    Period Weeks    Status New    Target Date 09/29/21      OT LONG TERM GOAL #2   Title Pt wil lbe Mod I scar management as seen by I ability to perform in the clinic    Time 8    Period Weeks    Status New    Target Date 09/29/21      OT LONG TERM GOAL #3   Title Pt will be Mod I ADL's and light functional use of left as non dominant hand as per pt report of ability to cut food, dress, bathe etc w/o assistance or simulation in the clinic    Time 8    Period Weeks    Status New    Target Date 09/29/21      OT LONG TERM GOAL #4   Title Pt will demonstrate improved A/ROM of digits left as seen by ability to oppose thumb to base of small finger and make a flat fist.    Time 8    Period Weeks    Status New    Target Date 09/29/21      OT LONG TERM GOAL #5   Title Pt will demonstrate decreaesd pain in left wrist as seen by pain rating as 2-3/10 or less during functional activity and simulated homemaking tasks    Time 8    Period Weeks    Status New    Target Date 09/29/21                   Plan - 09/16/21 1104     Clinical Impression Statement Pt is currently >  8 weeks post-op ORIF Lt distal radius fx. Pt very stiff with wrist ROM and significantly decreased grip strength compared to Rt, however supination has improved. Pt instructed to d/c splint at this time.    OT Occupational Profile and History Problem Focused Assessment - Including review of records relating to presenting problem    Occupational performance deficits (Please refer to evaluation for details): ADL's    Body  Structure / Function / Physical Skills ADL;Strength;Dexterity;Pain;Edema;UE functional use;ROM;Scar mobility;Coordination;Flexibility;Decreased knowledge of precautions;FMC    Rehab Potential Fair    Clinical Decision Making Limited treatment options, no task modification necessary    Comorbidities Affecting Occupational Performance: May have comorbidities impacting occupational performance    Modification or Assistance to Complete Evaluation  No modification of tasks or assist necessary to complete eval    OT Frequency Other (comment)   1-2x/week x6-8 weeks   OT Duration Other (comment)   1-2x/week over next 6-8 weeks   OT Treatment/Interventions Self-care/ADL training;Fluidtherapy;Splinting;Therapeutic activities;Therapeutic exercise;Scar mobilization;Cryotherapy;Passive range of motion;Manual Therapy;Patient/family education    Plan fluidotherapy, review previously issued HEP, add light weights for wrist strengthening, continue hand strengthening, UBE, monitor thumb MP and CMC joints d/t OA    Consulted and Agree with Plan of Care Patient             Patient will benefit from skilled therapeutic intervention in order to improve the following deficits and impairments:   Body Structure / Function / Physical Skills: ADL, Strength, Dexterity, Pain, Edema, UE functional use, ROM, Scar mobility, Coordination, Flexibility, Decreased knowledge of precautions, [facility]       Visit Diagnosis: Pain in left wrist  Pain in left hand  Generalized edema  Stiffness of left hand, not elsewhere classified  Muscle weakness (generalized)    Problem List Patient Active Problem List   Diagnosis Date Noted   Closed fracture of left distal radius 07/16/2021   Cognitive complaints with normal neuropsychological exam 03/18/2021   Encounter for monitoring denosumab therapy 11/02/2017   Bilateral impacted cerumen 10/03/2017   Frequent UTI 08/31/2017   Encounter for monitoring aromatase inhibitor  therapy 12/16/2015   Palpitation 08/18/2015   Encounter for follow-up surveillance of breast cancer 06/09/2015   Abnormal auditory perception 06/27/2014   Atrial premature depolarization 06/27/2014   Atrophic vaginitis 06/27/2014   Chronic cystitis 06/27/2014   Hyperlipemia 06/27/2014   Vitamin D deficiency 06/27/2014   Age-related osteoporosis without current pathological fracture 04/21/2014   GERD (gastroesophageal reflux disease) 04/21/2014   Hypothyroidism 04/21/2014   Malignant neoplasm of upper-outer quadrant of left breast in female, estrogen receptor positive ([place]) 04/21/2014   Osteoporosis 04/21/2014   Primary localized osteoarthrosis of hand 02/23/2013    Carey Bullocks, OTR/L 09/16/2021, [facility] [address] [place], [place], [address] Phone: [phone]   Fax:  [phone]  Name: Robin York MRN: [id number] Date of Birth: [birth year]

## 2021-09-16 NOTE — Patient Instructions (Signed)
°  Composite Extension (Passive Flexor Stretch)    Sitting with elbows on table and palms together, slowly lower wrists toward table until stretch is felt. Be sure to keep palms together throughout stretch. Hold __10__ seconds. Relax. Repeat __5__ times. Do _4-6___ sessions per day.  Wrist Passive Flexion    Rest left forearm on table, hand palm-down over edge. Bend wrist by pressing hand down with other hand. Hold __10__ seconds. Repeat _5___ times. Do __4-6__ sessions per day.  Pronation / Supination (Resistive)    Hold light hammer at middle and rotate palm up and down. Keep elbow flexed/bent at side and wrist straight. Repeat __10__ times. Do _4-6___ sessions per day.  1. Grip Strengthening (Resistive Putty)   Squeeze putty using thumb and all fingers. Repeat _15-20___ times. Do __2__ sessions per day.   2. Roll putty into tube on table and pinch between first two fingers and thumb x 10 reps supporting base of thumb. Do 2 sessions per day.

## 2021-09-17 ENCOUNTER — Encounter: Payer: Self-pay | Admitting: Urology

## 2021-09-17 LAB — URINE CULTURE

## 2021-09-21 ENCOUNTER — Ambulatory Visit: Payer: Medicare Other | Admitting: Orthopedic Surgery

## 2021-09-21 ENCOUNTER — Encounter: Payer: Self-pay | Admitting: Orthopedic Surgery

## 2021-09-21 ENCOUNTER — Other Ambulatory Visit: Payer: Self-pay

## 2021-09-21 ENCOUNTER — Ambulatory Visit (INDEPENDENT_AMBULATORY_CARE_PROVIDER_SITE_OTHER): Payer: Medicare Other

## 2021-09-21 ENCOUNTER — Other Ambulatory Visit: Payer: Self-pay | Admitting: Urology

## 2021-09-21 VITALS — BP 132/77 | HR 72

## 2021-09-21 DIAGNOSIS — S52572A Other intraarticular fracture of lower end of left radius, initial encounter for closed fracture: Secondary | ICD-10-CM

## 2021-09-21 DIAGNOSIS — N390 Urinary tract infection, site not specified: Secondary | ICD-10-CM

## 2021-09-21 MED ORDER — CEPHALEXIN 500 MG PO CAPS: 500.0000 mg | ORAL_CAPSULE | Freq: Every morning | ORAL | 5 refills | Status: AC

## 2021-09-21 NOTE — Telephone Encounter (Signed)
Please advise. I see you gave prn order for cipro but wanted to clarify if she will take the nightly preventative as well.

## 2021-09-21 NOTE — Progress Notes (Signed)
Post-Op Visit Note   Patient: Robin York           Date of Birth: June 07, 1951           MRN: [id number] Visit Date: 09/21/2021 PCP: Concepcion Elk, MD   Assessment & Plan:  Chief Complaint:  Chief Complaint  Patient presents with   Left Wrist - Follow-up   Visit Diagnoses:  1. Other closed intra-articular fracture of distal end of left radius, initial encounter     Plan: Discussed with patient that her x-rays look good with no visible fracture line and no change in hardware appearance.  She still has finger and wrist stiffness.  She is still working with therapy but is out of the splint at this point.  She still has symptomatic osteoarthritis involving the left thumb CMC joint which is quite bothersome.  She will continue to work hard with therapy.  I can see her back in another month unless she is having issues in which case I can see her sooner.   Follow-Up Instructions: No follow-ups on file.   Orders:  Orders Placed This Encounter  Procedures   XR Wrist Complete Left   No orders of the defined types were placed in this encounter.   Imaging: No results found.  PMFS History: Patient Active Problem List   Diagnosis Date Noted   Closed fracture of left distal radius 07/16/2021   Cognitive complaints with normal neuropsychological exam 03/18/2021   Encounter for monitoring denosumab therapy 11/02/2017   Bilateral impacted cerumen 10/03/2017   Frequent UTI 08/31/2017   Encounter for monitoring aromatase inhibitor therapy 12/16/2015   Palpitation 08/18/2015   Encounter for follow-up surveillance of breast cancer 06/09/2015   Abnormal auditory perception 06/27/2014   Atrial premature depolarization 06/27/2014   Atrophic vaginitis 06/27/2014   Chronic cystitis 06/27/2014   Hyperlipemia 06/27/2014   Vitamin D deficiency 06/27/2014   Age-related osteoporosis without current pathological fracture 04/21/2014   GERD (gastroesophageal reflux disease) 04/21/2014    Hypothyroidism 04/21/2014   Malignant neoplasm of upper-outer quadrant of left breast in female, estrogen receptor positive ([place]) 04/21/2014   Osteoporosis 04/21/2014   Primary localized osteoarthrosis of hand 02/23/2013   Past Medical History:  Diagnosis Date   Cancer ([place]) 2012   left breast cancer, stage I invasive lobular carcinoma   Frequent UTI    GERD (gastroesophageal reflux disease)    HLD (hyperlipidemia)    Hypothyroidism 04/21/2014   Osteoporosis    PAT (paroxysmal atrial tachycardia) (HCC)    no meds, no current problem   PONV (postoperative nausea and vomiting)     History reviewed. No pertinent family history.  Past Surgical History:  Procedure Laterality Date   BREAST SURGERY Bilateral 04/2011   bilateral mastectomies for left breast cancer with reconstruction   COLONOSCOPY  07/2020   FINGER SURGERY Left 07/2020   left hand -index finger   LAPAROSCOPIC ASSISTED VAGINAL HYSTERECTOMY     OPEN REDUCTION INTERNAL FIXATION (ORIF) DISTAL RADIAL FRACTURE Left 07/20/2021   Procedure: OPEN REDUCTION INTERNAL FIXATION (ORIF) LEFT DISTAL RADIAL FRACTURE;  Surgeon: Sherilyn Cooter, MD;  Location: [place];  Service: Orthopedics;  Laterality: Left;   Social History   Occupational History   Not on file  Tobacco Use   Smoking status: Never   Smokeless tobacco: Never  Vaping Use   Vaping Use: Never used  Substance and Sexual Activity   Alcohol use: Yes    Alcohol/week: 7.0 standard drinks    Types: 7 Glasses of  wine per week   Drug use: No   Sexual activity: Yes    Birth control/protection: None    Comment: Hysterectomy

## 2021-09-22 ENCOUNTER — Ambulatory Visit (INDEPENDENT_AMBULATORY_CARE_PROVIDER_SITE_OTHER): Payer: Medicare Other | Admitting: Occupational Therapy

## 2021-09-22 ENCOUNTER — Encounter: Payer: Self-pay | Admitting: Occupational Therapy

## 2021-09-22 DIAGNOSIS — M6281 Muscle weakness (generalized): Secondary | ICD-10-CM

## 2021-09-22 DIAGNOSIS — M25532 Pain in left wrist: Secondary | ICD-10-CM | POA: Diagnosis not present

## 2021-09-22 DIAGNOSIS — R278 Other lack of coordination: Secondary | ICD-10-CM

## 2021-09-22 DIAGNOSIS — M25642 Stiffness of left hand, not elsewhere classified: Secondary | ICD-10-CM | POA: Diagnosis not present

## 2021-09-22 DIAGNOSIS — M79642 Pain in left hand: Secondary | ICD-10-CM

## 2021-09-22 DIAGNOSIS — R601 Generalized edema: Secondary | ICD-10-CM

## 2021-09-22 NOTE — Therapy (Signed)
[facility] [address] [place], [place], [address] Phone: [phone]   Fax:  [phone]  Occupational Therapy Treatment  Patient Details  Name: Robin York MRN: [id number] Date of Birth: March 03, 1951 Referring Provider (OT): Dr Tempie Donning   Encounter Date: 09/22/2021   OT End of Session - 09/22/21 1032     Visit Number 5    Number of Visits 12    Date for OT Re-Evaluation 09/28/21    Authorization Type BCBS Medicare    Authorization - Visit Number 5    Progress Note Due on Visit 10    OT Start Time [phone]    OT Stop Time 1013    OT Time Calculation (min) 44 min    Activity Tolerance Patient tolerated treatment well    Behavior During Therapy [facility] for tasks assessed/performed             Past Medical History:  Diagnosis Date   Cancer ([place]) 2012   left breast cancer, stage I invasive lobular carcinoma   Frequent UTI    GERD (gastroesophageal reflux disease)    HLD (hyperlipidemia)    Hypothyroidism 04/21/2014   Osteoporosis    PAT (paroxysmal atrial tachycardia) (HCC)    no meds, no current problem   PONV (postoperative nausea and vomiting)     Past Surgical History:  Procedure Laterality Date   BREAST SURGERY Bilateral 04/2011   bilateral mastectomies for left breast cancer with reconstruction   COLONOSCOPY  07/2020   FINGER SURGERY Left 07/2020   left hand -index finger   LAPAROSCOPIC ASSISTED VAGINAL HYSTERECTOMY     OPEN REDUCTION INTERNAL FIXATION (ORIF) DISTAL RADIAL FRACTURE Left 07/20/2021   Procedure: OPEN REDUCTION INTERNAL FIXATION (ORIF) LEFT DISTAL RADIAL FRACTURE;  Surgeon: Sherilyn Cooter, MD;  Location: [place];  Service: Orthopedics;  Laterality: Left;    There were no vitals filed for this visit.   Subjective Assessment - 09/22/21 0931     Subjective  Pt reports increased ROM in digits with putty ex's.    Pertinent History Please refer to pt chart for details    Patient Stated Goals Get use of left UE  and hand back    Currently in Pain? Yes    Pain Score 1     Pain Location Wrist    Pain Orientation Left    Pain Descriptors / Indicators Aching    Pain Type Acute pain    Pain Onset More than a month ago    Pain Frequency Intermittent    Multiple Pain Sites No                          OT Treatments/Exercises (OP) - 09/22/21 0001       ADLs   ADL Comments Pt is no longer using splint. She continues to wear her edema glove left hand. She was encouraged to use her left hand for all ADL's and light functional activity at home (bathing, dressing, washing dishes, eating, cooking, doing her hair, holding a cup etc) as pt states that she does not usually do this. She is doing her exercises about 4x/day.      Exercises   Exercises Wrist;Hand      Weighted Stretch Over Towel Roll   Supination - Weighted Stretch 1 pound;2 pounds    Wrist Flexion - Weighted Stretch 1 pound;2 pounds   15 seconds, holding with fingers and not thumb, over edge of table. x5reps   Wrist Extension -  Weighted Stretch 1 pound;2 pounds   15 seconds, holding with fingers and not thumb, over edge of table. x5reps     Wrist Exercises   Other wrist exercises Forearm pronation and supination left wrist x10 reps   Discussed using 8oz hammer or weighted screwdriver or small water bottle at home for passive stretch. VC's and demo in clinic to avoid stress on her thumb. Pt returned demo in clinic   Other wrist exercises UBE x8 min for grip, wrist, shoulder and elbow ROM bilateral UE's. Focus on stretgthening, ROM and reciprocial movement rotating forward and back.      Hand Exercises   Other Hand Exercises OT performed P/ROM for wrist flexion, extension, finger flexion/extension x10 min    Other Hand Exercises Print out of HEP in clinic today and reviewed/performed      Manual Therapy   Manual Therapy Edema management;Passive ROM    Edema Management Performance of edema control techniques/ retrograde massage  for digits, hand, wrist and forearm left. Reviewed scar management/massage and desensitization.    Passive ROM Gentle P/ROM left hand/digits for passive flat fist and finger extension.               OT Education - 09/22/21 1032     Education Details P/ROM HEP for wrist, light strengthening for forearm rotation and hand strengthening w/ putty    Person(s) Educated Patient    Methods Explanation;Demonstration;Verbal cues;Handout    Comprehension Verbalized understanding;Returned demonstration;Verbal cues required              OT Short Term Goals - 08/31/21 1228       OT SHORT TERM GOAL #1   Title Pt will be Mod I splinting use, care and precautions left UE    Time 4    Period Weeks    Status Achieved    Target Date 09/01/21      OT SHORT TERM GOAL #2   Title Pt will be mod I initial HEP for L wrist and hand as seen by mod I ability to perform in the clinic    Time 4    Period Weeks    Status Achieved    Target Date 09/01/21      OT SHORT TERM GOAL #3   Title Pt will be Mod I edema control techniques L UE as seen by pt ability to state 2-3 techniques w/ 1 vc or less    Time 4    Period Weeks    Status Achieved    Target Date 09/01/21               OT Long Term Goals - 09/22/21 1036       OT LONG TERM GOAL #1   Title Pt will be Mod I upgraded HEP L wrist/hand    Time 8    Period Weeks    Status On-going    Target Date 09/29/21      OT LONG TERM GOAL #2   Title Pt will be Mod I scar management as seen by I ability to perform in the clinic    Time 8    Period Weeks    Status Achieved    Target Date 09/29/21      OT LONG TERM GOAL #3   Title Pt will be Mod I ADL's and light functional use of left as non dominant hand as per pt report of ability to cut food, dress, bathe etc w/o assistance or simulation in the clinic  Time 8    Period Weeks    Status On-going      OT LONG TERM GOAL #4   Title Pt will demonstrate improved A/ROM of digits left as  seen by ability to oppose thumb to base of small finger and make a flat fist.    Time 8    Period Weeks    Status On-going    Target Date 09/29/21      OT LONG TERM GOAL #5   Title Pt will demonstrate decreaesd pain in left wrist as seen by pain rating as 2-3/10 or less during functional activity and simulated homemaking tasks    Time 8    Period Weeks    Status Achieved    Target Date 09/29/21               Plan - 09/22/21 1033     Clinical Impression Statement Pt is currently 9 weeks and 2 days post-op Left distal radius ORIF. Her finger A/ROM has improved but she is with noted limitations secondary to L thumb arthritis. Updated HEP today for P/ROM should allow for increased flexibility and ROM. Pt also encouraged to use Left hand for all daily activity at this time, examples given in the clinic today.    OT Occupational Profile and History Problem Focused Assessment - Including review of records relating to presenting problem    Occupational performance deficits (Please refer to evaluation for details): ADL's    Body Structure / Function / Physical Skills ADL;Strength;Dexterity;Pain;Edema;UE functional use;ROM;Scar mobility;Coordination;Flexibility;Decreased knowledge of precautions;FMC    Rehab Potential Fair    Clinical Decision Making Limited treatment options, no task modification necessary    Comorbidities Affecting Occupational Performance: May have comorbidities impacting occupational performance    Modification or Assistance to Complete Evaluation  No modification of tasks or assist necessary to complete eval    OT Frequency Other (comment)   1-2x/week for 6-8 weeks   OT Duration Other (comment)   1-2x/week for 6-8 weeks   OT Treatment/Interventions Self-care/ADL training;Fluidtherapy;Splinting;Therapeutic activities;Therapeutic exercise;Scar mobilization;Cryotherapy;Passive range of motion;Manual Therapy;Patient/family education    Plan UBE, review previously issued HEP,  light weights for wrist strengthening, continue hand strengthening, monitor thumb MP and CMC joints d/t OA    Consulted and Agree with Plan of Care Patient             Patient will benefit from skilled therapeutic intervention in order to improve the following deficits and impairments:   Body Structure / Function / Physical Skills: ADL, Strength, Dexterity, Pain, Edema, UE functional use, ROM, Scar mobility, Coordination, Flexibility, Decreased knowledge of precautions, FMC    Composite Extension (Passive Flexor Stretch) (You can do this stretch at the wall as shown in therapy.)    Sitting with elbows on table and palms together, slowly lower wrists toward table until stretch is felt. Be sure to keep palms together throughout stretch. Hold __10__ seconds. Relax. Repeat _5___ times. Do __4-5_ sessions per day.  Wrist Passive Flexion    Rest right forearm on table, hand palm-down over edge. Bend wrist by pressing hand down with other hand. Hold ___10_ seconds. Repeat _4-6___ times. Do __4-6__ sessions per day. You can also do this stretch at home with a 1-2# weight over the edge of a table or countertop (do not use your thumb, hold with fingers).  http://[email or web address].exer.us/157   Pronation / Supination (Resistive)    Hold 8 oz hammer, water bottle or 1# weight and rotate palm up and down. Keep  elbow flexed at side and wrist straight. Repeat _10___ times. Hold for 3-5 seconds. Do __4-6__ sessions per day.  Grip Strengthening (Resistive Putty)    Squeeze putty using thumb and all fingers. Repeat _15-20__ times. Do __2_ sessions per day.  Roll putty into tube and pinch between first 3 fingers, hold base of thumb. X10 reps    Visit Diagnosis: Pain in left wrist  Pain in left hand  Generalized edema  Stiffness of left hand, not elsewhere classified  Muscle weakness (generalized)  Other lack of coordination    Problem List Patient Active Problem List   Diagnosis Date  Noted   Closed fracture of left distal radius 07/16/2021   Cognitive complaints with normal neuropsychological exam 03/18/2021   Encounter for monitoring denosumab therapy 11/02/2017   Bilateral impacted cerumen 10/03/2017   Frequent UTI 08/31/2017   Encounter for monitoring aromatase inhibitor therapy 12/16/2015   Palpitation 08/18/2015   Encounter for follow-up surveillance of breast cancer 06/09/2015   Abnormal auditory perception 06/27/2014   Atrial premature depolarization 06/27/2014   Atrophic vaginitis 06/27/2014   Chronic cystitis 06/27/2014   Hyperlipemia 06/27/2014   Vitamin D deficiency 06/27/2014   Age-related osteoporosis without current pathological fracture 04/21/2014   GERD (gastroesophageal reflux disease) 04/21/2014   Hypothyroidism 04/21/2014   Malignant neoplasm of upper-outer quadrant of left breast in female, estrogen receptor positive ([place]) 04/21/2014   Osteoporosis 04/21/2014   Primary localized osteoarthrosis of hand 02/23/2013    Almyra Deforest, OT 09/22/2021, 10:40 AM  [facility] [address] [place], [place], [address] Phone: [phone]   Fax:  [phone]  Name: Robin York MRN: [id number] Date of Birth: 1951-08-28

## 2021-09-22 NOTE — Patient Instructions (Addendum)
Composite Extension (Electrical engineer) (You can do this stretch at the wall as shown in therapy.)    Sitting with elbows on table and palms together, slowly lower wrists toward table until stretch is felt. Be sure to keep palms together throughout stretch. Hold __10__ seconds. Relax. Repeat _5___ times. Do __4-5_ sessions per day.  Wrist Passive Flexion    Rest right forearm on table, hand palm-down over edge. Bend wrist by pressing hand down with other hand. Hold ___10_ seconds. Repeat _4-6___ times. Do __4-6__ sessions per day. You can also do this stretch at home with a 1-2# weight over the edge of a table or countertop (do not use your thumb, hold with fingers).  http://[email or web address].exer.us/157   Pronation / Supination (Resistive)    Hold 8 oz hammer, water bottle or 1# weight and rotate palm up and down. Keep elbow flexed at side and wrist straight. Repeat _10___ times. Hold for 3-5 seconds. Do __4-6__ sessions per day.  Grip Strengthening (Resistive Putty)    Squeeze putty using thumb and all fingers. Repeat _15-20__ times. Do __2_ sessions per day.  Roll putty into tube and pinch between first 3 fingers, hold base of thumb. X10 reps

## 2021-09-28 ENCOUNTER — Encounter: Payer: Self-pay | Admitting: Occupational Therapy

## 2021-09-28 ENCOUNTER — Ambulatory Visit (INDEPENDENT_AMBULATORY_CARE_PROVIDER_SITE_OTHER): Payer: Medicare Other | Admitting: Occupational Therapy

## 2021-09-28 ENCOUNTER — Other Ambulatory Visit: Payer: Self-pay

## 2021-09-28 DIAGNOSIS — M79642 Pain in left hand: Secondary | ICD-10-CM | POA: Diagnosis not present

## 2021-09-28 DIAGNOSIS — M6281 Muscle weakness (generalized): Secondary | ICD-10-CM

## 2021-09-28 DIAGNOSIS — M25532 Pain in left wrist: Secondary | ICD-10-CM | POA: Diagnosis not present

## 2021-09-28 DIAGNOSIS — R278 Other lack of coordination: Secondary | ICD-10-CM

## 2021-09-28 DIAGNOSIS — R601 Generalized edema: Secondary | ICD-10-CM | POA: Diagnosis not present

## 2021-09-28 DIAGNOSIS — M25642 Stiffness of left hand, not elsewhere classified: Secondary | ICD-10-CM | POA: Diagnosis not present

## 2021-09-28 NOTE — Therapy (Signed)
[facility] Physical Therapy [address] [place], [place], [address] Phone: [phone]   Fax:  [phone]  Occupational Therapy Treatment & Recertification  Patient Details  Name: Memorie Yokoyama MRN: [id number] Date of Birth: 12-16-1950 Referring Provider (OT): Dr Tempie Donning   Encounter Date: 09/28/2021   OT End of Session - 09/28/21 1247     Visit Number 6    Number of Visits 12    Date for OT Re-Evaluation 09/28/21    Authorization Type BCBS Medicare    Authorization - Visit Number 6    Progress Note Due on Visit 10    OT Start Time 1145    OT Stop Time 4481    OT Time Calculation (min) 49 min    Activity Tolerance Patient tolerated treatment well    Behavior During Therapy [facility] for tasks assessed/performed             Past Medical History:  Diagnosis Date   Cancer ([place]) 2012   left breast cancer, stage I invasive lobular carcinoma   Frequent UTI    GERD (gastroesophageal reflux disease)    HLD (hyperlipidemia)    Hypothyroidism 04/21/2014   Osteoporosis    PAT (paroxysmal atrial tachycardia) (HCC)    no meds, no current problem   PONV (postoperative nausea and vomiting)     Past Surgical History:  Procedure Laterality Date   BREAST SURGERY Bilateral 04/2011   bilateral mastectomies for left breast cancer with reconstruction   COLONOSCOPY  07/2020   FINGER SURGERY Left 07/2020   left hand -index finger   LAPAROSCOPIC ASSISTED VAGINAL HYSTERECTOMY     OPEN REDUCTION INTERNAL FIXATION (ORIF) DISTAL RADIAL FRACTURE Left 07/20/2021   Procedure: OPEN REDUCTION INTERNAL FIXATION (ORIF) LEFT DISTAL RADIAL FRACTURE;  Surgeon: Sherilyn Cooter, MD;  Location: [place];  Service: Orthopedics;  Laterality: Left;    There were no vitals filed for this visit.   Subjective Assessment - 09/28/21 1149     Subjective  Pt reports "some days are better than others" Pt reports increased ROM after last visit per her report.    Pertinent History Please  refer to pt chart for details    Patient Stated Goals Get use of left UE and hand back    Currently in Pain? Yes    Pain Score 1     Pain Location Wrist    Pain Orientation Left    Pain Descriptors / Indicators Aching    Pain Type Chronic pain    Pain Onset More than a month ago    Pain Frequency Intermittent    Aggravating Factors  Movement    Pain Relieving Factors Rest, heat    Multiple Pain Sites No                [facility] OT Assessment - 09/28/21 0001       AROM   Overall AROM Comments Lt wrist flexion = 49, extension = 24                 OT Treatments/Exercises (OP) - 09/28/21 0001       ADLs   ADL Comments Pt is using her left hand to bathe, dress, and homemaking etc. Things that require increased grip are still difficult, opening lids/jars etc. 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      Exercises   Exercises Wrist;Hand      Weighted Stretch Over Towel Roll   Supination - Weighted Stretch --  A/AROM for supination (No weight secondary to thumb OA)   Pronation - Weighted Stretch --   A/AROM secondary to thumb OA   Wrist Flexion - Weighted Stretch 2 pounds   A/AROM secondary to thumb OA. x10 reps   Wrist Extension - Weighted Stretch 1 pound   holding w/ fingers and not using thumb. x10 reps     Wrist Exercises   Wrist Flexion AROM;AAROM;Left;5 reps;Seated   A/AROM see above   Wrist Extension AAROM;Left;5 reps;Seated;AROM    Other wrist exercises Forearm pronation and supination left wrist x10 reps Pt is using lighter weight hammer.    Other wrist exercises UBE x8 min for grip, wrist, shoulder and elbow ROM bilateral UE's. Focus on strengthening, ROM and reciprocial movement rotating forward and back.      Hand Exercises   Other Hand Exercises OT performed P/ROM for wrist flexion, extension, finger flexion/extension and retrograde massage of digits, hand and wrist x10 min      Manual Therapy   Manual Therapy --   See above                 OT Education - 09/28/21 1246     Education Details HEP, putty w/o using thumb, A/ROM and A/AROM, gentle P/ROM left wrist, use left hand for ADL's and liht functional activity as able    Person(s) Educated Patient    Methods Explanation;Demonstration;Verbal cues    Comprehension Verbalized understanding;Returned demonstration              OT Short Term Goals - 08/31/21 1228       OT SHORT TERM GOAL #1   Title Pt will be Mod I splinting use, care and precautions left UE    Time 4    Period Weeks    Status Achieved    Target Date 09/01/21      OT SHORT TERM GOAL #2   Title Pt will be mod I initial HEP for L wrist and hand as seen by mod I ability to perform in the clinic    Time 4    Period Weeks    Status Achieved    Target Date 09/01/21      OT SHORT TERM GOAL #3   Title Pt will be Mod I edema control techniques L UE as seen by pt ability to state 2-3 techniques w/ 1 vc or less    Time 4    Period Weeks    Status Achieved    Target Date 09/01/21               OT Long Term Goals - 09/28/21 1250       OT LONG TERM GOAL #1   Title Pt will be Mod I upgraded HEP L wrist/hand    Time 8    Period Weeks    Status On-going    Target Date 09/29/21      OT LONG TERM GOAL #2   Title Pt will be Mod I scar management as seen by I ability to perform in the clinic    Time 8    Period Weeks    Status Achieved      OT LONG TERM GOAL #3   Title Pt will be Mod I ADL's and light functional use of left as non dominant hand as per pt report of ability to cut food, dress, bathe etc w/o assistance or simulation in the clinic    Time 8    Period Weeks  Status On-going    Target Date 09/29/21      OT LONG TERM GOAL #4   Title Pt will demonstrate improved A/ROM of digits left as seen by ability to oppose thumb to base of small finger and make a flat fist.    Time 8    Period Weeks    Status On-going    Target Date 09/29/21      OT LONG TERM  GOAL #5   Title Pt will demonstrate decreaesd pain in left wrist as seen by pain rating as 2-3/10 or less during functional activity and simulated homemaking tasks    Time 8    Period Weeks    Status Achieved    Target Date 09/29/21               Plan - 09/28/21 1248     Clinical Impression Statement Pt is currently 10 wks s/p ORIF L distal radius fracture. She is limited in active ROM secondary to h/o thumb   OA. Adapted HEP to assist with incresed A/ROM & P/ROM left wrist. Pt   reports that she is using her hand for light functional activity and ADL's   at home. She continues to make slow steady gains in ROM and functional activity per re-assessment today and should benefit from continued out pt OT to assist in maximizing overall functional use of left non-dominant hand/wrist    OT Occupational Profile and History Problem Focused Assessment - Including review of records relating to presenting problem    Occupational performance deficits (Please refer to evaluation for details): ADL's    Body Structure / Function / Physical Skills ADL;Strength;Dexterity;Pain;Edema;UE functional use;ROM;Scar mobility;Coordination;Flexibility;Decreased knowledge of precautions;FMC    Rehab Potential Fair    Clinical Decision Making Limited treatment options, no task modification necessary    Comorbidities Affecting Occupational Performance: May have comorbidities impacting occupational performance    Modification or Assistance to Complete Evaluation  No modification of tasks or assist necessary to complete eval    OT Frequency Other (comment)   1-2x/week for 6-8 weeks   OT Duration Other (comment)   1-2x/week for 6-8 weeks   OT Treatment/Interventions Self-care/ADL training;Fluidtherapy;Splinting;Therapeutic activities;Therapeutic exercise;Scar mobilization;Cryotherapy;Passive range of motion;Manual Therapy;Patient/family education    Plan UBE, review HEP, light weights for wrist strengthening, continue  hand strengthening, monitor thumb MP and CMC joints d/t OA    Consulted and Agree with Plan of Care Patient             Patient will benefit from skilled therapeutic intervention in order to improve the following deficits and impairments:   Body Structure / Function / Physical Skills: ADL, Strength, Dexterity, Pain, Edema, UE functional use, ROM, Scar mobility, Coordination, Flexibility, Decreased knowledge of precautions, [facility]       Visit Diagnosis: Pain in left wrist - Plan: Ot plan of care cert/re-cert  Pain in left hand - Plan: Ot plan of care cert/re-cert  Generalized edema - Plan: Ot plan of care cert/re-cert  Stiffness of left hand, not elsewhere classified - Plan: Ot plan of care cert/re-cert  Muscle weakness (generalized) - Plan: Ot plan of care cert/re-cert  Other lack of coordination - Plan: Ot plan of care cert/re-cert    Problem List Patient Active Problem List   Diagnosis Date Noted   Closed fracture of left distal radius 07/16/2021   Cognitive complaints with normal neuropsychological exam 03/18/2021   Encounter for monitoring denosumab therapy 11/02/2017   Bilateral impacted cerumen 10/03/2017   Frequent UTI  08/31/2017   Encounter for monitoring aromatase inhibitor therapy 12/16/2015   Palpitation 08/18/2015   Encounter for follow-up surveillance of breast cancer 06/09/2015   Abnormal auditory perception 06/27/2014   Atrial premature depolarization 06/27/2014   Atrophic vaginitis 06/27/2014   Chronic cystitis 06/27/2014   Hyperlipemia 06/27/2014   Vitamin D deficiency 06/27/2014   Age-related osteoporosis without current pathological fracture 04/21/2014   GERD (gastroesophageal reflux disease) 04/21/2014   Hypothyroidism 04/21/2014   Malignant neoplasm of upper-outer quadrant of left breast in female, estrogen receptor positive ([place]) 04/21/2014   Osteoporosis 04/21/2014   Primary localized osteoarthrosis of hand 02/23/2013    Almyra Deforest, OT 09/28/2021, 12:59 PM  [facility] Physical Therapy [address] [place], [place], [address] Phone: [phone]   Fax:  [phone]  Name: Renita Brocks MRN: [id number] Date of Birth: Jan 06, 1951

## 2021-10-07 ENCOUNTER — Encounter: Payer: Medicare Other | Admitting: Occupational Therapy

## 2021-10-12 ENCOUNTER — Encounter: Payer: Medicare Other | Admitting: Occupational Therapy

## 2021-10-14 ENCOUNTER — Ambulatory Visit (INDEPENDENT_AMBULATORY_CARE_PROVIDER_SITE_OTHER): Payer: Medicare Other | Admitting: Occupational Therapy

## 2021-10-14 ENCOUNTER — Encounter: Payer: Self-pay | Admitting: Occupational Therapy

## 2021-10-14 ENCOUNTER — Other Ambulatory Visit: Payer: Self-pay

## 2021-10-14 DIAGNOSIS — M25532 Pain in left wrist: Secondary | ICD-10-CM | POA: Diagnosis not present

## 2021-10-14 DIAGNOSIS — R278 Other lack of coordination: Secondary | ICD-10-CM

## 2021-10-14 DIAGNOSIS — M25642 Stiffness of left hand, not elsewhere classified: Secondary | ICD-10-CM

## 2021-10-14 DIAGNOSIS — M79642 Pain in left hand: Secondary | ICD-10-CM | POA: Diagnosis not present

## 2021-10-14 DIAGNOSIS — M6281 Muscle weakness (generalized): Secondary | ICD-10-CM

## 2021-10-14 DIAGNOSIS — R601 Generalized edema: Secondary | ICD-10-CM

## 2021-10-14 NOTE — Therapy (Signed)
[facility] Physical Therapy [address] [place], [place], [address] Phone: [phone]   Fax:  [phone]  Occupational Therapy Treatment  Patient Details  Name: Robin York MRN: [id number] Date of Birth: [birth year] Referring Provider (OT): Dr Tempie Donning   Encounter Date: 10/14/2021   OT End of Session - 10/14/21 1624     Visit Number 7    Number of Visits 12    Date for OT Re-Evaluation 10/26/21    Authorization Type BCBS Medicare    Authorization - Visit Number 7    Progress Note Due on Visit 10    OT Start Time [phone]    OT Stop Time 0930    OT Time Calculation (min) 46 min    Activity Tolerance Patient tolerated treatment well    Behavior During Therapy [facility] for tasks assessed/performed             Past Medical History:  Diagnosis Date   Cancer ([place]) 2012   left breast cancer, stage I invasive lobular carcinoma   Frequent UTI    GERD (gastroesophageal reflux disease)    HLD (hyperlipidemia)    Hypothyroidism 04/21/2014   Osteoporosis    PAT (paroxysmal atrial tachycardia) (HCC)    no meds, no current problem   PONV (postoperative nausea and vomiting)     Past Surgical History:  Procedure Laterality Date   BREAST SURGERY Bilateral 04/2011   bilateral mastectomies for left breast cancer with reconstruction   COLONOSCOPY  07/2020   FINGER SURGERY Left 07/2020   left hand -index finger   LAPAROSCOPIC ASSISTED VAGINAL HYSTERECTOMY     OPEN REDUCTION INTERNAL FIXATION (ORIF) DISTAL RADIAL FRACTURE Left 07/20/2021   Procedure: OPEN REDUCTION INTERNAL FIXATION (ORIF) LEFT DISTAL RADIAL FRACTURE;  Surgeon: Sherilyn Cooter, MD;  Location: [facility];  Service: Orthopedics;  Laterality: Left;    There were no vitals filed for this visit.   Subjective Assessment - 10/14/21 0847     Subjective  "I have done something to my shoulder" "It happened in the last 2 weeks" Pt denies pain and states that she is a 2/10 only someties with certain activity.     Pertinent History Please refer to pt chart for details    Patient Stated Goals Get use of left UE and hand back    Currently in Pain? No/denies    Pain Score 0-No pain    Multiple Pain Sites No             [facility] OT Assessment - 10/14/21 0001       AROM   Overall AROM Comments L wrist flexion = 52*, extension = 38*      Hand Function   Left Hand Grip (lbs) 14.2    Comment L forearm supination 82*   Pronation = WNL's             OT Treatments/Exercises (OP) - 10/14/21 0001       ADLs   ADL Comments Pt is no longer wearing edema glove and is using her hand more which has assisted her in decreased edema throughout her R hand. She is mod I with ADL's and light functional use. Her thumb arthritis does limit her at times expscially for grip/pinch tasks. She was encouraged to contiune using her left hand as able for all functionla activity until she discusses f/u for her thumb arthritis with Dr Tempie Donning. Pt reports shoulder pain that "began within the last 2 weeks" PT screened pt and felt as though pt may  have some irritation of her RTC specifically her supraspinatus in her L UE. She was encouraged to contiune using her left UD in pain free range as she reports that her pain decreases as the day proresses. She was also told to avoid the protected position and move her arm as able throughout the day. She verbalized understanding of this.      Exercises   Exercises Wrist;Hand      Weighted Stretch Over Towel Roll   Supination - Weighted Stretch --   A/AROM for supination (No weight secondary to thumb OA)   Pronation - Weighted Stretch Other (comment)   A/AROM secondary to thumb OA   Wrist Flexion - Weighted Stretch 2 pounds   A/AROM secondary to thumb OA. x10 reps   Wrist Extension - Weighted Stretch 2 pounds   holding w/ fingers and not using thumb. x10 reps     Wrist Exercises   Other wrist exercises UBE x8 min for grip, wrist, shoulder and elbow ROM bilateral UE's level 1. Focus  on strengthening, ROM and reciprocial movement rotating forward and back.      Hand Exercises   Other Hand Exercises OT performed P/ROM for wrist flexion, extension, finger flexion/extension and retrograde massage of digits, hand and wrist x10 min             OT Short Term Goals - 08/31/21 1228       OT SHORT TERM GOAL #1   Title Pt will be Mod I splinting use, care and precautions left UE    Time 4    Period Weeks    Status Achieved    Target Date 09/01/21      OT SHORT TERM GOAL #2   Title Pt will be mod I initial HEP for L wrist and hand as seen by mod I ability to perform in the clinic    Time 4    Period Weeks    Status Achieved    Target Date 09/01/21      OT SHORT TERM GOAL #3   Title Pt will be Mod I edema control techniques L UE as seen by pt ability to state 2-3 techniques w/ 1 vc or less    Time 4    Period Weeks    Status Achieved    Target Date 09/01/21             OT Long Term Goals - 10/14/21 1645       OT LONG TERM GOAL #1   Title Pt will be Mod I upgraded HEP L wrist/hand    Time 8    Period Weeks    Status Achieved    Target Date 10/26/21      OT LONG TERM GOAL #2   Title Pt will be Mod I scar management as seen by I ability to perform in the clinic    Time 8    Period Weeks    Status Achieved    Target Date 09/29/21      OT LONG TERM GOAL #3   Title Pt will be Mod I ADL's and light functional use of left as non dominant hand as per pt report of ability to cut food, dress, bathe etc w/o assistance or simulation in the clinic    Time 8    Period Weeks    Status On-going    Target Date 10/26/21      OT LONG TERM GOAL #4   Title Pt will demonstrate improved  A/ROM of digits left as seen by ability to oppose thumb to base of small finger and make a flat fist.    Time 8    Period Weeks    Status Achieved    Target Date 10/26/21      OT LONG TERM GOAL #5   Title Pt will demonstrate decreaesd pain in left wrist as seen by pain rating as  2-3/10 or less during functional activity and simulated homemaking tasks    Time 8    Period Weeks    Status Achieved    Target Date 09/29/21              Plan - 10/14/21 1637     Clinical Impression Statement Pt is currently 12 wks s/p ORIF L distal radius fracture. She is limited in active ROM secondary to h/o thumb OA. She is Mod I with adapted HEP to assist with incresed A/ROM & P/ROM left wrist using 2# weights (w/o thumb involvement). Pt reports that she is using her hand for all light functional activity and ADL's at home. She has increased active finger ROM and markedly decreased edema in her left hand. She was last seen in OT 2 weeks ago and reports ~2 weeks shoulder/suprasinatus tendon pain. She was screened by PT and advised to use her L UE in painfree range as able and OT advised her to avoid holding her UE in the protected position. She believes that her pain generally improves as her day progresses, therefore A/ROM is encouraged. If her symptoms don't continue to improve, she may beneft from a PT referral. Focus on LTG's    OT Occupational Profile and History Problem Focused Assessment - Including review of records relating to presenting problem    Occupational performance deficits (Please refer to evaluation for details): ADL's    Body Structure / Function / Physical Skills ADL;Strength;Dexterity;Pain;Edema;UE functional use;ROM;Scar mobility;Coordination;Flexibility;Decreased knowledge of precautions;FMC    Rehab Potential Fair    Clinical Decision Making Limited treatment options, no task modification necessary    Comorbidities Affecting Occupational Performance: May have comorbidities impacting occupational performance    Modification or Assistance to Complete Evaluation  No modification of tasks or assist necessary to complete eval    OT Frequency Other (comment)   1-2x/week for 6-8 weeks   OT Duration Other (comment)    OT Treatment/Interventions Self-care/ADL  training;Fluidtherapy;Splinting;Therapeutic activities;Therapeutic exercise;Scar mobilization;Cryotherapy;Passive range of motion;Manual Therapy;Patient/family education    Plan UBE, review/upgrade HEP as able, light weights for wrist strengthening, continue hand strengthening, monitor thumb MP and CMC joints d/t OA. Begin d/c planning    Consulted and Agree with Plan of Care Patient            Patient will benefit from skilled therapeutic intervention in order to improve the following deficits and impairments:   Body Structure / Function / Physical Skills: ADL, Strength, Dexterity, Pain, Edema, UE functional use, ROM, Scar mobility, Coordination, Flexibility, Decreased knowledge of precautions, [facility]     Visit Diagnosis: Pain in left wrist  Pain in left hand  Generalized edema  Stiffness of left hand, not elsewhere classified  Muscle weakness (generalized)  Other lack of coordination  Problem List Patient Active Problem List   Diagnosis Date Noted   Closed fracture of left distal radius 07/16/2021   Cognitive complaints with normal neuropsychological exam 03/18/2021   Encounter for monitoring denosumab therapy 11/02/2017   Bilateral impacted cerumen 10/03/2017   Frequent UTI 08/31/2017   Encounter for monitoring aromatase inhibitor therapy  12/16/2015   Palpitation 08/18/2015   Encounter for follow-up surveillance of breast cancer 06/09/2015   Abnormal auditory perception 06/27/2014   Atrial premature depolarization 06/27/2014   Atrophic vaginitis 06/27/2014   Chronic cystitis 06/27/2014   Hyperlipemia 06/27/2014   Vitamin D deficiency 06/27/2014   Age-related osteoporosis without current pathological fracture 04/21/2014   GERD (gastroesophageal reflux disease) 04/21/2014   Hypothyroidism 04/21/2014   Malignant neoplasm of upper-outer quadrant of left breast in female, estrogen receptor positive ([place]) 04/21/2014   Osteoporosis 04/21/2014   Primary localized  osteoarthrosis of hand 02/23/2013   Almyra Deforest, OT 10/14/2021, 4:47 PM  [facility] Physical Therapy [address] [place], [place], [address] Phone: [phone]   Fax:  [phone]  Name: Robin York MRN: [id number] Date of Birth: 1950/11/25

## 2021-10-20 ENCOUNTER — Ambulatory Visit (INDEPENDENT_AMBULATORY_CARE_PROVIDER_SITE_OTHER): Payer: Medicare Other | Admitting: Occupational Therapy

## 2021-10-20 ENCOUNTER — Encounter: Payer: Self-pay | Admitting: Occupational Therapy

## 2021-10-20 ENCOUNTER — Other Ambulatory Visit: Payer: Self-pay

## 2021-10-20 DIAGNOSIS — R278 Other lack of coordination: Secondary | ICD-10-CM

## 2021-10-20 DIAGNOSIS — R601 Generalized edema: Secondary | ICD-10-CM

## 2021-10-20 DIAGNOSIS — M25642 Stiffness of left hand, not elsewhere classified: Secondary | ICD-10-CM

## 2021-10-20 DIAGNOSIS — M25532 Pain in left wrist: Secondary | ICD-10-CM

## 2021-10-20 DIAGNOSIS — M6281 Muscle weakness (generalized): Secondary | ICD-10-CM

## 2021-10-20 DIAGNOSIS — M79642 Pain in left hand: Secondary | ICD-10-CM

## 2021-10-20 NOTE — Therapy (Signed)
[place], [place], [address] Phone: [phone]   Fax:  [phone]  Occupational Therapy Treatment  Patient Details  Name: Robin York MRN: [id number] Date of Birth: 02-Feb-1951 Referring Provider (OT): Dr Tempie Donning   Encounter Date: 10/20/2021   OT End of Session - 10/20/21 0805     Visit Number 8    Number of Visits 12    Date for OT Re-Evaluation 10/26/21    Authorization - Visit Number 8    Progress Note Due on Visit 10    OT Start Time 0802    OT Stop Time 0845    OT Time Calculation (min) 43 min    Activity Tolerance Patient tolerated treatment well    Behavior During Therapy [facility] for tasks assessed/performed             Past Medical History:  Diagnosis Date   Cancer ([place]) 2012   left breast cancer, stage I invasive lobular carcinoma   Frequent UTI    GERD (gastroesophageal reflux disease)    HLD (hyperlipidemia)    Hypothyroidism 04/21/2014   Osteoporosis    PAT (paroxysmal atrial tachycardia) (HCC)    no meds, no current problem   PONV (postoperative nausea and vomiting)     Past Surgical History:  Procedure Laterality Date   BREAST SURGERY Bilateral 04/2011   bilateral mastectomies for left breast cancer with reconstruction   COLONOSCOPY  07/2020   FINGER SURGERY Left 07/2020   left hand -index finger   LAPAROSCOPIC ASSISTED VAGINAL HYSTERECTOMY     OPEN REDUCTION INTERNAL FIXATION (ORIF) DISTAL RADIAL FRACTURE Left 07/20/2021   Procedure: OPEN REDUCTION INTERNAL FIXATION (ORIF) LEFT DISTAL RADIAL FRACTURE;  Surgeon: Sherilyn Cooter, MD;  Location: [place];  Service: Orthopedics;  Laterality: Left;    There were no vitals filed for this visit.   Subjective Assessment - 10/20/21 0806     Subjective  I am lacking in this way "wrist extension. She is improving  in wrist flexion per her report. She continues to have supraspinatus tendon irritation.    Pertinent History Please refer to pt  chart for details    Patient Stated Goals Get use of left UE and hand back    Currently in Pain? No/denies    Pain Score 0-No pain    Multiple Pain Sites No              OT Treatments/Exercises (OP) - 10/20/21 0001       ADLs   ADL Comments Pt is having pain in her left shoulder. She was educated in using her L UE for functional activity and avoidance of keeping it in the protected position. Examples were reviewed in the clinic. Pt is overall Mod I with ADL's. 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 x10 reps   Wrist Extension - Weighted Stretch 2 pounds   holding w/ fingers and not using thumb. x10 reps     Wrist Exercises   Wrist Flexion PROM;AAROM;10 reps;Seated;Left    Wrist Extension PROM;AAROM;Left;10 reps    Other wrist exercises Forearm pronation and supination  left wrist x5 reps    Other wrist exercises UBE x8 min for grip, wrist, shoulder and elbow ROM bilateral UE's level 1. Focus on strengthening, ROM and reciprocial movement rotating forward and back.      Hand Exercises   Other Hand Exercises OT performed P/ROM for wrist flexion, extension, finger flexion/extension and retrograde massage of digits, hand and wrist x10 min    Other Hand Exercises Pt was educated in HEP to include towel stretch for left shoulder at home. Pt may benefit from using a belt to assist with gliding over shoulder to make it easier on her left thumb/CMC joint. Pt was able to return demonstration in clinic. Pt was advised to perform minimum of 2-3 x/day x 5-10 reps each and to also use L shoulder and avoid holding it in the protected position during daily activities. Pt veralized understanding               OT Short Term Goals - 08/31/21 1228       OT SHORT TERM GOAL #1   Title Pt will be Mod I splinting use, care and precautions left UE    Time 4    Period Weeks    Status Achieved    Target Date 09/01/21      OT SHORT TERM GOAL #2   Title Pt will be mod I initial HEP for L wrist and hand as seen by mod I ability to perform in the clinic    Time 4    Period Weeks    Status Achieved    Target Date 09/01/21      OT SHORT TERM GOAL #3   Title Pt will be Mod I edema control techniques L UE as seen by pt ability to state 2-3 techniques w/ 1 vc or less    Time 4    Period Weeks    Status Achieved    Target Date 09/01/21              OT Long Term Goals - 10/14/21 1645       OT LONG TERM GOAL #1   Title Pt will be Mod I upgraded HEP L wrist/hand    Time 8    Period Weeks    Status Achieved    Target Date 10/26/21      OT LONG TERM GOAL #2   Title Pt will be Mod I scar management as seen by I ability to perform in the clinic    Time 8    Period Weeks    Status Achieved    Target Date 09/29/21      OT LONG TERM GOAL #3   Title Pt will be Mod I ADL's and light functional use of left as non dominant hand as per pt report of ability to cut food, dress, bathe etc w/o assistance or simulation in the clinic    Time 8    Period Weeks    Status On-going    Target Date 10/26/21      OT LONG TERM GOAL #4   Title Pt will demonstrate improved A/ROM of digits left as seen by ability to oppose thumb to base of small finger and make a flat fist.    Time 8    Period Weeks    Status Achieved    Target Date 10/26/21      OT LONG TERM GOAL #5   Title Pt will demonstrate decreaesd pain in left wrist as seen by pain  rating as 2-3/10 or less during functional activity and simulated homemaking tasks    Time 8    Period Weeks    Status Achieved    Target Date 09/29/21            Patient will benefit from skilled therapeutic intervention in order to improve the  following deficits and impairments:      Visit Diagnosis: Pain in left wrist  Pain in left hand  Generalized edema  Stiffness of left hand, not elsewhere classified  Muscle weakness (generalized)  Other lack of coordination  Problem List Patient Active Problem List   Diagnosis Date Noted   Closed fracture of left distal radius 07/16/2021   Cognitive complaints with normal neuropsychological exam 03/18/2021   Encounter for monitoring denosumab therapy 11/02/2017   Bilateral impacted cerumen 10/03/2017   Frequent UTI 08/31/2017   Encounter for monitoring aromatase inhibitor therapy 12/16/2015   Palpitation 08/18/2015   Encounter for follow-up surveillance of breast cancer 06/09/2015   Abnormal auditory perception 06/27/2014   Atrial premature depolarization 06/27/2014   Atrophic vaginitis 06/27/2014   Chronic cystitis 06/27/2014   Hyperlipemia 06/27/2014   Vitamin D deficiency 06/27/2014   Age-related osteoporosis without current pathological fracture 04/21/2014   GERD (gastroesophageal reflux disease) 04/21/2014   Hypothyroidism 04/21/2014   Malignant neoplasm of upper-outer quadrant of left breast in female, estrogen receptor positive ([place]) 04/21/2014   Osteoporosis 04/21/2014   Primary localized osteoarthrosis of hand 02/23/2013   Almyra Deforest, OT 10/20/2021, 10:47 AM  [facility] Physical Therapy [address] [place], [place], [address] Phone: [phone]   Fax:  [phone]  Name: Robin York MRN: [id number] Date of Birth: 02/08/1951

## 2021-10-21 ENCOUNTER — Ambulatory Visit: Payer: Medicare Other | Admitting: Orthopedic Surgery

## 2021-10-21 ENCOUNTER — Telehealth: Payer: Self-pay

## 2021-10-21 NOTE — Telephone Encounter (Signed)
Attempted to contact patient however had to leave a voicemail. Patient will need to contact our office so that we can reschedule her appointment with Dr. Tempie Donning.

## 2021-10-27 ENCOUNTER — Encounter: Payer: Self-pay | Admitting: Rehabilitative and Restorative Service Providers"

## 2021-10-27 ENCOUNTER — Ambulatory Visit (INDEPENDENT_AMBULATORY_CARE_PROVIDER_SITE_OTHER): Payer: Medicare Other | Admitting: Rehabilitative and Restorative Service Providers"

## 2021-10-27 ENCOUNTER — Other Ambulatory Visit: Payer: Self-pay

## 2021-10-27 DIAGNOSIS — M79642 Pain in left hand: Secondary | ICD-10-CM

## 2021-10-27 DIAGNOSIS — M6281 Muscle weakness (generalized): Secondary | ICD-10-CM

## 2021-10-27 DIAGNOSIS — R601 Generalized edema: Secondary | ICD-10-CM

## 2021-10-27 DIAGNOSIS — R278 Other lack of coordination: Secondary | ICD-10-CM

## 2021-10-27 DIAGNOSIS — M25512 Pain in left shoulder: Secondary | ICD-10-CM

## 2021-10-27 DIAGNOSIS — M25532 Pain in left wrist: Secondary | ICD-10-CM

## 2021-10-27 DIAGNOSIS — M25612 Stiffness of left shoulder, not elsewhere classified: Secondary | ICD-10-CM

## 2021-10-27 DIAGNOSIS — M25642 Stiffness of left hand, not elsewhere classified: Secondary | ICD-10-CM

## 2021-10-27 NOTE — Therapy (Signed)
OUTPATIENT OCCUPATIONAL THERAPY TREATMENT NOTE & Recertification   Patient Name: Robin York MRN: [id number] DOB:06/29/1951, 71 y.o., female Today's Date: 10/27/2021  PCP: Concepcion Elk, MD REFERRING PROVIDER: Sherilyn Cooter, MD   Past Medical History:  Diagnosis Date   Cancer [facility]) 2012   left breast cancer, stage I invasive lobular carcinoma   Frequent UTI    GERD (gastroesophageal reflux disease)    HLD (hyperlipidemia)    Hypothyroidism 04/21/2014   Osteoporosis    PAT (paroxysmal atrial tachycardia) (HCC)    no meds, no current problem   PONV (postoperative nausea and vomiting)    Past Surgical History:  Procedure Laterality Date   BREAST SURGERY Bilateral 04/2011   bilateral mastectomies for left breast cancer with reconstruction   COLONOSCOPY  07/2020   FINGER SURGERY Left 07/2020   left hand -index finger   LAPAROSCOPIC ASSISTED VAGINAL HYSTERECTOMY     OPEN REDUCTION INTERNAL FIXATION (ORIF) DISTAL RADIAL FRACTURE Left 07/20/2021   Procedure: OPEN REDUCTION INTERNAL FIXATION (ORIF) LEFT DISTAL RADIAL FRACTURE;  Surgeon: Sherilyn Cooter, MD;  Location: [place];  Service: Orthopedics;  Laterality: Left;   Patient Active Problem List   Diagnosis Date Noted   Closed fracture of left distal radius 07/16/2021   Cognitive complaints with normal neuropsychological exam 03/18/2021   Encounter for monitoring denosumab therapy 11/02/2017   Bilateral impacted cerumen 10/03/2017   Frequent UTI 08/31/2017   Encounter for monitoring aromatase inhibitor therapy 12/16/2015   Palpitation 08/18/2015   Encounter for follow-up surveillance of breast cancer 06/09/2015   Abnormal auditory perception 06/27/2014   Atrial premature depolarization 06/27/2014   Atrophic vaginitis 06/27/2014   Chronic cystitis 06/27/2014   Hyperlipemia 06/27/2014   Vitamin D deficiency 06/27/2014   Age-related osteoporosis without current pathological fracture 04/21/2014   GERD (gastroesophageal  reflux disease) 04/21/2014   Hypothyroidism 04/21/2014   Malignant neoplasm of upper-outer quadrant of left breast in female, estrogen receptor positive ([place]) 04/21/2014   Osteoporosis 04/21/2014   Primary localized osteoarthrosis of hand 02/23/2013    ONSET DATE: DOI: 06/17/21, DOS: 07/20/22  REFERRING DIAG: L27.517G (ICD-10-CM) - Other closed intra-articular fracture of distal end of left radius, initial encounter  THERAPY DIAG:  Pain in left wrist  Pain in left hand  Generalized edema  Other lack of coordination  Muscle weakness (generalized)  Stiffness of left hand, not elsewhere classified  Stiffness of left shoulder, not elsewhere classified  Acute pain of left shoulder   PERTINENT HISTORY: Pt fell in closet, fx Lt distal radius, getting ORIF on 07/20/22. 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   Also history of past: Lt finger sx, hx of breast CA in remission now.  PRECAUTIONS: none now more than 12 weeks out from wrist sx  SUBJECTIVE: She states that her shoulder is better than last session after starting internal rotation (IR) stretches. She states no pain in wrist but feeling very stiff in extension and demos inability to make a tight fist (fingers touch palm but not distal crease). She states motivated to continue therapy to address all issues. She will also be gone next week for vacation, so therapy will resume the following week, pending MD co-sign.   PAIN:  Are you having pain? No, not at rest NPRS scale: 0/10 Pain location: Lt shoulder and wrist intermittently Pain description: shoulder: sharp with sudden motions; wrist: dull at times Aggravating factors: sudden motions, weight  bearing Relieving factors: rest, stretches, heat, ice, etc.    OBJECTIVE:   TODAY'S TREATMENT:  10/27/21: Visit was a reassessment today, she seems to have  made progress in terms of motion, strength, pain levels, etc., but is still lacking necessary motion and strength to perform all I/ADLs. She reviews IR shoulder stretches, was educated on new prayer wrist ext stretches, also "stable-c" management for thumb OA and deformation (webspace stretches, thumb palmer abduction stretches, grip in "c" position, 1st dorsal interossei strength). Additionally she reviews finger and wrist stretches and performs with some cues needed. She will need a more comprehensive, updated HEP in next session, but was given a limited handout today.   She also performs grip, A/ROM at shoulder, wrist, hand for reassessment measures (see below assessment sections). She discusses functional activities and issues while performing Quick DASH for current functional assessment (scores a 25% impairment today, has issues washing back, holding knife, shoulder pain, etc.).     PATIENT EDUCATION: Education details: see above tx section Person educated: Patient Education method: Explanation, Demonstration, Tactile cues, Verbal cues, and Handouts Education comprehension: verbalized understanding, returned demonstration, verbal cues required, tactile cues required, and needs further education   HOME EXERCISE PROGRAM See above tx section (pt currently has HEP for A/P/ROM at wrist, fingers- is getting updates, especially for shoulder problems).     OBJECTIVE MEASURES 10/27/21:  (*ROM is A/ROM unless otherwise stated)    Lt grip strength: 23# (compared to 60# Rt hand)  Lt wrist flex: improved to 21* now   Lt writ ext: improved to 40* now  Lt sh flex: 102*   Lt sh abd: 120*  Lt sh ER: 77*  Lt sh IR: 62*  Gap from pad MF to [facility]: 2cm  Lt FA supination: 70*  Functional Measure: Quick DASH 25% impairment today   OT Short Term Goals - All Met as of 08/31/21 1228       OT SHORT TERM GOAL #1   Title Pt will be Mod I splinting use, care and precautions left UE    Time 4    Period Weeks     Status Achieved    Target Date 09/01/21      OT SHORT TERM GOAL #2   Title Pt will be mod I initial HEP for L wrist and hand as seen by mod I ability to perform in the clinic    Time 4    Period Weeks    Status Achieved    Target Date 09/01/21      OT SHORT TERM GOAL #3   Title Pt will be Mod I edema control techniques L UE as seen by pt ability to state 2-3 techniques w/ 1 vc or less    Time 4    Period Weeks    Status Achieved    Target Date 09/01/21              OT Long Term Goals - UPDATED 10/27/21      OT LONG TERM GOAL #1   Title Pt will be Mod I upgraded HEP L wrist/hand    Time 8    Period Weeks    Status Achieved    Target Date 10/26/21      OT LONG TERM GOAL #2   Title Pt will be Mod I scar management as seen by I ability to perform in the clinic    Time 8    Period Weeks    Status Achieved  Target Date 09/29/21      OT LONG TERM GOAL #3   Title Pt will be Mod I ADL's and light functional use of left as non dominant hand as per pt report of ability to cut food, dress, bathe etc w/o assistance or simulation in the clinic    Time 8    Period Weeks    Status Met 10/27/21   Target Date 10/26/21      OT LONG TERM GOAL #4   Title Pt will demonstrate improved A/ROM of digits left as seen by ability to oppose thumb to base of small finger and make a flat fist.    Time 8    Period Weeks    Status Achieved    Target Date 10/26/21      OT LONG TERM GOAL #5   Title Pt will demonstrate decreaesd pain in left wrist as seen by pain rating as 2-3/10 or less during functional activity and simulated homemaking tasks    Time 8    Period Weeks    Status Achieved    Target Date 09/29/21        OT Long Term Goal #6  In 5 weeks, she will increase L grip strength from 23# to at least 30# for basic functional strength in home setting.   Status: New Goal Today 10/27/21 Target Date: 12/01/21   OT Long term Goal #7  In 5 weeks, she will increase Lt wrist ext from 40* to  at least 55* for ability to hold objects in open palm.   Status: New Goal Today 10/27/21 Target Date: 12/01/21   OT long Term Goal #7 In 5 weeks, she will increase Lt shoulder flexion from 102* painfully, to 125* non-painful for functional reach into cupboards.   Status: New Goal Today 10/27/21 Target Date: 12/01/21   OT Long Term Goal #8 In 5 weeks, she will decrease functional impairment per Quick DASH from 25% to 10% or better, for quality of life improvement.   Status: New Goal Today 10/27/21 Target Date: 12/01/21            Plan -  UPDATED 10/27/21      Clinical Impression Statement 10/27/21: Visit was a reassessment today, she has made excellent progress in terms of motion, strength, pain levels, etc., but is still lacking necessary motion and strength to perform all I/ADLs. She also needed new goals for emerging shoulder issue. Additionally, she has obvious b/l thumb CMC OA that could be addressed, but she wishes to finish wrist and shoulder therapy first.       OT Frequency 2xweek for 4 weeks (tapering as appropriate) starting, 11/08/21, after 1 week hold in therapy.     OT Duration 4 additional weeks after 1 week hold (until 12/01/21)    OT Treatment/Interventions Self-care/ADL training; Fluidtherapy; Splinting; Therapeutic activities; Therapeutic exercise; Cryotherapy; Moist Heat; Passive range of motion; Manual Therapy; Patient/family education     Plan 10/27/21: Pt will move to 2/week visits for at least the next 2 weeks to focus on wrist motion, strength; finger closure; shoulder motion strength and decreasing pain. Give comprehensive, updated HEP for reach goal group.       Consulted and Agree with Plan of Care Patient       Benito Mccreedy, OTR/L, CHT 10/27/2021, 3:47 PM

## 2021-10-28 ENCOUNTER — Encounter: Payer: Self-pay | Admitting: Orthopedic Surgery

## 2021-10-28 ENCOUNTER — Ambulatory Visit: Payer: Medicare Other | Admitting: Orthopedic Surgery

## 2021-10-28 ENCOUNTER — Ambulatory Visit (INDEPENDENT_AMBULATORY_CARE_PROVIDER_SITE_OTHER): Payer: Medicare Other

## 2021-10-28 DIAGNOSIS — S52572A Other intraarticular fracture of lower end of left radius, initial encounter for closed fracture: Secondary | ICD-10-CM | POA: Diagnosis not present

## 2021-10-28 MED ORDER — MELOXICAM 7.5 MG PO TABS: 7.5000 mg | ORAL_TABLET | Freq: Every day | ORAL | 0 refills | Status: DC

## 2021-10-28 NOTE — Progress Notes (Signed)
Office Visit Note   Patient: Robin York           Date of Birth: 02/19/1951           MRN: [id number] Visit Date: 10/28/2021              Requested by: Concepcion Elk, MD [address] [place],  [place] [address] PCP: Concepcion Elk, MD   Assessment & Plan: Visit Diagnoses:  1. Other closed intra-articular fracture of distal end of left radius, initial encounter     Plan: Patient is now over 3 months out from her distal radius fracture fixation.  She continues to do well.  Her swelling and finger ROM have improved with therapy.  She has no wrist pain.  Her pain is coming from her [facility] joint at this point.  We discussed strategies for treating CMC arthritis and will start her on an oral anti-inflammatory.  She had a hard time with diclofenac so will try meloxicam.  I'll see her back in another month.   Follow-Up Instructions: No follow-ups on file.   Orders:  Orders Placed This Encounter  Procedures   XR Wrist Complete Left   No orders of the defined types were placed in this encounter.     Procedures: No procedures performed   Clinical Data: No additional findings.   Subjective: Chief Complaint  Patient presents with   Left Wrist - Pain    This is a 71 year old right-hand-dominant female who presents for follow-up of a left distal radius fracture status post ORIF in November 1 of last year.  She is doing quite well.  Her swelling in her wrist and hand is much improved.  Has been working hard with therapy.  She can make a near complete fist with her fingers.  She has been working on her putting and chipping is able to grip a golf club with minimal difficulty.  Her biggest issue at this point seems to be her [employer] joint.  She not taking medications currently.  She tried diclofenac previously for her thumb but did not tolerate it secondary to GI issues.   Review of Systems   Objective: Vital Signs: There were no vitals taken for this visit.  Physical Exam  Left Hand  Exam   Tenderness  The patient is experiencing no tenderness.   Other  Erythema: absent Sensation: normal Pulse: present  Comments:  Able to make near complete fist with mild finger swelling.  No pain w/ wrist ROM.  Focal swelling around [facility] joint w/ + CMC grind test and compensatory MP hyper-extension.     Specialty Comments:  No specialty comments available.  Imaging: No results found.   PMFS History: Patient Active Problem List   Diagnosis Date Noted   Closed fracture of left distal radius 07/16/2021   Cognitive complaints with normal neuropsychological exam 03/18/2021   Encounter for monitoring denosumab therapy 11/02/2017   Bilateral impacted cerumen 10/03/2017   Frequent UTI 08/31/2017   Encounter for monitoring aromatase inhibitor therapy 12/16/2015   Palpitation 08/18/2015   Encounter for follow-up surveillance of breast cancer 06/09/2015   Abnormal auditory perception 06/27/2014   Atrial premature depolarization 06/27/2014   Atrophic vaginitis 06/27/2014   Chronic cystitis 06/27/2014   Hyperlipemia 06/27/2014   Vitamin D deficiency 06/27/2014   Age-related osteoporosis without current pathological fracture 04/21/2014   GERD (gastroesophageal reflux disease) 04/21/2014   Hypothyroidism 04/21/2014   Malignant neoplasm of upper-outer quadrant of left breast in female, estrogen receptor positive ([place])  04/21/2014   Osteoporosis 04/21/2014   Primary localized osteoarthrosis of hand 02/23/2013   Past Medical History:  Diagnosis Date   Cancer ([place]) 2012   left breast cancer, stage I invasive lobular carcinoma   Frequent UTI    GERD (gastroesophageal reflux disease)    HLD (hyperlipidemia)    Hypothyroidism 04/21/2014   Osteoporosis    PAT (paroxysmal atrial tachycardia) (HCC)    no meds, no current problem   PONV (postoperative nausea and vomiting)     History reviewed. No pertinent family history.  Past Surgical History:  Procedure Laterality Date    BREAST SURGERY Bilateral 04/2011   bilateral mastectomies for left breast cancer with reconstruction   COLONOSCOPY  07/2020   FINGER SURGERY Left 07/2020   left hand -index finger   LAPAROSCOPIC ASSISTED VAGINAL HYSTERECTOMY     OPEN REDUCTION INTERNAL FIXATION (ORIF) DISTAL RADIAL FRACTURE Left 07/20/2021   Procedure: OPEN REDUCTION INTERNAL FIXATION (ORIF) LEFT DISTAL RADIAL FRACTURE;  Surgeon: Sherilyn Cooter, MD;  Location: [place];  Service: Orthopedics;  Laterality: Left;   Social History   Occupational History   Not on file  Tobacco Use   Smoking status: Never   Smokeless tobacco: Never  Vaping Use   Vaping Use: Never used  Substance and Sexual Activity   Alcohol use: Yes    Alcohol/week: 7.0 standard drinks    Types: 7 Glasses of wine per week   Drug use: No   Sexual activity: Yes    Birth control/protection: None    Comment: Hysterectomy

## 2021-11-08 ENCOUNTER — Encounter: Payer: Self-pay | Admitting: Rehabilitative and Restorative Service Providers"

## 2021-11-08 ENCOUNTER — Other Ambulatory Visit: Payer: Self-pay

## 2021-11-08 ENCOUNTER — Ambulatory Visit (INDEPENDENT_AMBULATORY_CARE_PROVIDER_SITE_OTHER): Payer: Medicare Other | Admitting: Rehabilitative and Restorative Service Providers"

## 2021-11-08 DIAGNOSIS — R278 Other lack of coordination: Secondary | ICD-10-CM | POA: Diagnosis not present

## 2021-11-08 DIAGNOSIS — R601 Generalized edema: Secondary | ICD-10-CM | POA: Diagnosis not present

## 2021-11-08 DIAGNOSIS — M25642 Stiffness of left hand, not elsewhere classified: Secondary | ICD-10-CM

## 2021-11-08 DIAGNOSIS — M25532 Pain in left wrist: Secondary | ICD-10-CM | POA: Diagnosis not present

## 2021-11-08 DIAGNOSIS — M25512 Pain in left shoulder: Secondary | ICD-10-CM

## 2021-11-08 DIAGNOSIS — M6281 Muscle weakness (generalized): Secondary | ICD-10-CM | POA: Diagnosis not present

## 2021-11-08 DIAGNOSIS — M25612 Stiffness of left shoulder, not elsewhere classified: Secondary | ICD-10-CM

## 2021-11-08 DIAGNOSIS — M79642 Pain in left hand: Secondary | ICD-10-CM

## 2021-11-08 NOTE — Therapy (Signed)
OUTPATIENT OCCUPATIONAL THERAPY TREATMENT NOTE    Patient Name: Robin York MRN: [id number] DOB:06/26/1951, 71 y.o., female Today's Date: 11/08/2021  PCP: Concepcion Elk, MD REFERRING PROVIDER: Sherilyn Cooter, MD   Past Medical History:  Diagnosis Date   Cancer [facility]) 2012   left breast cancer, stage I invasive lobular carcinoma   Frequent UTI    GERD (gastroesophageal reflux disease)    HLD (hyperlipidemia)    Hypothyroidism 04/21/2014   Osteoporosis    PAT (paroxysmal atrial tachycardia) (HCC)    no meds, no current problem   PONV (postoperative nausea and vomiting)    Past Surgical History:  Procedure Laterality Date   BREAST SURGERY Bilateral 04/2011   bilateral mastectomies for left breast cancer with reconstruction   COLONOSCOPY  07/2020   FINGER SURGERY Left 07/2020   left hand -index finger   LAPAROSCOPIC ASSISTED VAGINAL HYSTERECTOMY     OPEN REDUCTION INTERNAL FIXATION (ORIF) DISTAL RADIAL FRACTURE Left 07/20/2021   Procedure: OPEN REDUCTION INTERNAL FIXATION (ORIF) LEFT DISTAL RADIAL FRACTURE;  Surgeon: Sherilyn Cooter, MD;  Location: [place];  Service: Orthopedics;  Laterality: Left;   Patient Active Problem List   Diagnosis Date Noted   Closed fracture of left distal radius 07/16/2021   Cognitive complaints with normal neuropsychological exam 03/18/2021   Encounter for monitoring denosumab therapy 11/02/2017   Bilateral impacted cerumen 10/03/2017   Frequent UTI 08/31/2017   Encounter for monitoring aromatase inhibitor therapy 12/16/2015   Palpitation 08/18/2015   Encounter for follow-up surveillance of breast cancer 06/09/2015   Abnormal auditory perception 06/27/2014   Atrial premature depolarization 06/27/2014   Atrophic vaginitis 06/27/2014   Chronic cystitis 06/27/2014   Hyperlipemia 06/27/2014   Vitamin D deficiency 06/27/2014   Age-related osteoporosis without current pathological fracture 04/21/2014   GERD (gastroesophageal reflux disease)  04/21/2014   Hypothyroidism 04/21/2014   Malignant neoplasm of upper-outer quadrant of left breast in female, estrogen receptor positive ([place]) 04/21/2014   Osteoporosis 04/21/2014   Primary localized osteoarthrosis of hand 02/23/2013    ONSET DATE: DOI: 06/17/21, DOS: 07/20/22  REFERRING DIAG: X48.016P (ICD-10-CM) - Other closed intra-articular fracture of distal end of left radius, initial encounter  THERAPY DIAG:  Pain in left wrist  Other lack of coordination  Muscle weakness (generalized)  Generalized edema  Pain in left hand  Stiffness of left hand, not elsewhere classified  Acute pain of left shoulder  Stiffness of left shoulder, not elsewhere classified   PERTINENT HISTORY: Pt fell in closet, fx Lt distal radius, getting ORIF on 07/20/22. 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   Also history of past: Lt finger sx, hx of breast CA in remission now.  PRECAUTIONS: none now more than 12 weeks out from wrist sx  SUBJECTIVE: She states had a good vacation, she is still having morning stiffness, thumb still painful and shoulder somewhat stiff, but no pain right now. She states worry that she is "hitting a wall."     PAIN:  Are you having pain? No, not at rest NPRS scale: 0/10 Pain location: Lt shoulder and wrist intermittently Pain description: shoulder: sharp with sudden motions; wrist: dull at times Aggravating factors: sudden motions, weight bearing Relieving factors: rest, stretches, heat, ice, etc.    OBJECTIVE:   TODAY'S TREATMENT:  11/08/21: Pt reviews HEP for wrist and performs AROM with overpressure in fluidotherapy for 5 mins, stating looser at end. OT  then performs manual therapy and IASTM to wrist and hand (for thumb OA as well).  After gentle joint oscillations, manual stretches with patient positioning, etc., wrist ext improves  from 39* to 45* post manual therapy. Also discusses shoulder HEP and she states doing flexion A/ROM, IR stretches behind back and cross body posterior capsule stretches. We will focus on shoulder next session.     10/27/21: Visit was a reassessment today, she seems to have made progress in terms of motion, strength, pain levels, etc., but is still lacking necessary motion and strength to perform all I/ADLs. She reviews IR shoulder stretches, was educated on new prayer wrist ext stretches, also "stable-c" management for thumb OA and deformation (webspace stretches, thumb palmer abduction stretches, grip in "c" position, 1st dorsal interossei strength). Additionally she reviews finger and wrist stretches and performs with some cues needed. She will need a more comprehensive, updated HEP in next session, but was given a limited handout today.   She also performs grip, A/ROM at shoulder, wrist, hand for reassessment measures (see below assessment sections). She discusses functional activities and issues while performing Quick DASH for current functional assessment (scores a 25% impairment today, has issues washing back, holding knife, shoulder pain, etc.).     PATIENT EDUCATION: Education details: see above tx section Person educated: Patient Education method: Explanation, Demonstration, Tactile cues, Verbal cues, and Handouts Education comprehension: verbalized understanding, returned demonstration, verbal cues required, tactile cues required, and needs further education   HOME EXERCISE PROGRAM Access Code: [facility] URL: [email or web address] Date: 11/08/2021 Prepared by: Benito Mccreedy  Exercises Seated Composite Thumb Flexion PROM - 2-3 x daily - 3-5 reps - 15 sec hold Seated Thumb and Index Finger Webspace Stretch - 2-3 x daily - 3-5 reps - 15 sec hold Resisted Finger Abduction - Index and Middle - 2-3 x daily - 5-10 reps - 2-3 sec hold Towel Roll Grip with Forearm in Neutral  - 2-3 x daily - 3-5 reps - 10 sec hold Thumb Opposition - 2-3 x daily - 10 reps Seated Wrist Flexion Stretch - 2-3 x daily - 3 reps - 15 hold Wrist Extension Stretch Pronated - 2-3 x daily - 3 reps - 15 hold    OBJECTIVE MEASURES 10/27/21:  (*ROM is A/ROM unless otherwise stated)    Lt grip strength: 23# (compared to 60# Rt hand)  Lt wrist flex: improved to 40* now   Lt writ ext: improved to 40* now  Lt sh flex: 102*   Lt sh abd: 120*  Lt sh ER: 82*  Lt sh IR: 50*  Gap from pad MF to [facility]: 2cm  Lt FA supination: 70*  Functional Measure: Quick DASH 25% impairment today   OT Short Term Goals - All Met as of 08/31/21 1228       OT SHORT TERM GOAL #1   Title Pt will be Mod I splinting use, care and precautions left UE    Time 4    Period Weeks    Status Achieved    Target Date 09/01/21      OT SHORT TERM GOAL #2   Title Pt will be mod I initial HEP for L wrist and hand as seen by mod I ability to perform in the clinic    Time 4    Period Weeks    Status Achieved    Target Date 09/01/21      OT SHORT TERM GOAL #3   Title Pt will be Mod I edema control  techniques L UE as seen by pt ability to state 2-3 techniques w/ 1 vc or less    Time 4    Period Weeks    Status Achieved    Target Date 09/01/21              OT Long Term Goals - UPDATED 10/27/21      OT LONG TERM GOAL #1   Title Pt will be Mod I upgraded HEP L wrist/hand    Time 8    Period Weeks    Status Achieved    Target Date 10/26/21      OT LONG TERM GOAL #2   Title Pt will be Mod I scar management as seen by I ability to perform in the clinic    Time 8    Period Weeks    Status Achieved    Target Date 09/29/21      OT LONG TERM GOAL #3   Title Pt will be Mod I ADL's and light functional use of left as non dominant hand as per pt report of ability to cut food, dress, bathe etc w/o assistance or simulation in the clinic    Time 8    Period Weeks    Status Met 10/27/21   Target Date 10/26/21      OT  LONG TERM GOAL #4   Title Pt will demonstrate improved A/ROM of digits left as seen by ability to oppose thumb to base of small finger and make a flat fist.    Time 8    Period Weeks    Status Achieved    Target Date 10/26/21      OT LONG TERM GOAL #5   Title Pt will demonstrate decreaesd pain in left wrist as seen by pain rating as 2-3/10 or less during functional activity and simulated homemaking tasks    Time 8    Period Weeks    Status Achieved    Target Date 09/29/21        OT Long Term Goal #6  In 5 weeks, she will increase L grip strength from 23# to at least 30# for basic functional strength in home setting.   Status: New Goal Today 10/27/21 Target Date: 12/01/21   OT Long term Goal #7  In 5 weeks, she will increase Lt wrist ext from 40* to at least 55* for ability to hold objects in open palm.   Status: New Goal Today 10/27/21 Target Date: 12/01/21   OT long Term Goal #7 In 5 weeks, she will increase Lt shoulder flexion from 102* painfully, to 125* non-painful for functional reach into cupboards.   Status: New Goal Today 10/27/21 Target Date: 12/01/21   OT Long Term Goal #8 In 5 weeks, she will decrease functional impairment per Quick DASH from 25% to 10% or better, for quality of life improvement.   Status: New Goal Today 10/27/21 Target Date: 12/01/21            Plan -  UPDATED 10/27/21      Clinical Impression Statement 11/08/21: Pt did not lose much ground while on vacation, she doesn't seem to be hitting physical plateau despite her feeling as such. Keep going with POC.  10/27/21: Visit was a reassessment today, she has made excellent progress in terms of motion, strength, pain levels, etc., but is still lacking necessary motion and strength to perform all I/ADLs. She also needed new goals for emerging shoulder issue. Additionally, she has obvious b/l thumb CMC  OA that could be addressed, but she wishes to finish wrist and shoulder therapy first.       OT Frequency 2xweek for  4 weeks (tapering as appropriate) starting, 11/08/21, after 1 week hold in therapy.     OT Duration 4 additional weeks after 1 week hold (until 12/01/21)    OT Treatment/Interventions Self-care/ADL training; Fluidtherapy; Splinting; Therapeutic activities; Therapeutic exercise; Cryotherapy; Moist Heat; Passive range of motion; Manual Therapy; Patient/family education     Plan 11/08/21: for wrist/hand try ktape, add thumb abd rubber band, also focus on shoulder issues.   10/27/21: Pt will move to 2/week visits for at least the next 2 weeks to focus on wrist motion, strength; finger closure; shoulder motion strength and decreasing pain. Give comprehensive, updated HEP for reach goal group.       Consulted and Agree with Plan of Care Patient       Benito Mccreedy, OTR/L, CHT 11/08/2021, 2:24 PM

## 2021-11-11 ENCOUNTER — Encounter: Payer: Medicare Other | Admitting: Rehabilitative and Restorative Service Providers"

## 2021-11-15 ENCOUNTER — Ambulatory Visit (INDEPENDENT_AMBULATORY_CARE_PROVIDER_SITE_OTHER): Payer: Medicare Other | Admitting: Rehabilitative and Restorative Service Providers"

## 2021-11-15 ENCOUNTER — Other Ambulatory Visit: Payer: Self-pay

## 2021-11-15 DIAGNOSIS — M25512 Pain in left shoulder: Secondary | ICD-10-CM

## 2021-11-15 DIAGNOSIS — R278 Other lack of coordination: Secondary | ICD-10-CM | POA: Diagnosis not present

## 2021-11-15 DIAGNOSIS — M25532 Pain in left wrist: Secondary | ICD-10-CM | POA: Diagnosis not present

## 2021-11-15 DIAGNOSIS — M25612 Stiffness of left shoulder, not elsewhere classified: Secondary | ICD-10-CM

## 2021-11-15 DIAGNOSIS — R601 Generalized edema: Secondary | ICD-10-CM | POA: Diagnosis not present

## 2021-11-15 DIAGNOSIS — M25642 Stiffness of left hand, not elsewhere classified: Secondary | ICD-10-CM

## 2021-11-15 DIAGNOSIS — M6281 Muscle weakness (generalized): Secondary | ICD-10-CM

## 2021-11-15 DIAGNOSIS — M79642 Pain in left hand: Secondary | ICD-10-CM

## 2021-11-15 NOTE — Therapy (Signed)
OUTPATIENT OCCUPATIONAL THERAPY TREATMENT NOTE    Patient Name: Robin York MRN: [id number] DOB:November 23, 1950, 71 y.o., female Today's Date: 11/15/2021  PCP: Concepcion Elk, MD REFERRING PROVIDER: Sherilyn Cooter, MD   Past Medical History:  Diagnosis Date   Cancer [facility]) 2012   left breast cancer, stage I invasive lobular carcinoma   Frequent UTI    GERD (gastroesophageal reflux disease)    HLD (hyperlipidemia)    Hypothyroidism 04/21/2014   Osteoporosis    PAT (paroxysmal atrial tachycardia) (HCC)    no meds, no current problem   PONV (postoperative nausea and vomiting)    Past Surgical History:  Procedure Laterality Date   BREAST SURGERY Bilateral 04/2011   bilateral mastectomies for left breast cancer with reconstruction   COLONOSCOPY  07/2020   FINGER SURGERY Left 07/2020   left hand -index finger   LAPAROSCOPIC ASSISTED VAGINAL HYSTERECTOMY     OPEN REDUCTION INTERNAL FIXATION (ORIF) DISTAL RADIAL FRACTURE Left 07/20/2021   Procedure: OPEN REDUCTION INTERNAL FIXATION (ORIF) LEFT DISTAL RADIAL FRACTURE;  Surgeon: Sherilyn Cooter, MD;  Location: [place];  Service: Orthopedics;  Laterality: Left;   Patient Active Problem List   Diagnosis Date Noted   Closed fracture of left distal radius 07/16/2021   Cognitive complaints with normal neuropsychological exam 03/18/2021   Encounter for monitoring denosumab therapy 11/02/2017   Bilateral impacted cerumen 10/03/2017   Frequent UTI 08/31/2017   Encounter for monitoring aromatase inhibitor therapy 12/16/2015   Palpitation 08/18/2015   Encounter for follow-up surveillance of breast cancer 06/09/2015   Abnormal auditory perception 06/27/2014   Atrial premature depolarization 06/27/2014   Atrophic vaginitis 06/27/2014   Chronic cystitis 06/27/2014   Hyperlipemia 06/27/2014   Vitamin D deficiency 06/27/2014   Age-related osteoporosis without current pathological fracture 04/21/2014   GERD (gastroesophageal reflux disease)  04/21/2014   Hypothyroidism 04/21/2014   Malignant neoplasm of upper-outer quadrant of left breast in female, estrogen receptor positive ([place]) 04/21/2014   Osteoporosis 04/21/2014   Primary localized osteoarthrosis of hand 02/23/2013    ONSET DATE: DOI: 06/17/21, DOS: 07/20/22  REFERRING DIAG: Q11.941D (ICD-10-CM) - Other closed intra-articular fracture of distal end of left radius, initial encounter  THERAPY DIAG:  Pain in left wrist  Other lack of coordination  Muscle weakness (generalized)  Generalized edema  Pain in left hand  Stiffness of left hand, not elsewhere classified  Acute pain of left shoulder  Stiffness of left shoulder, not elsewhere classified   PERTINENT HISTORY: Pt fell in closet, fx Lt distal radius, getting ORIF on 07/20/22. 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   Also history of past: Lt finger sx, hx of breast CA in remission now.  PRECAUTIONS: none now more than 12 weeks out from wrist sx  SUBJECTIVE: She states managing hand well, but shoulder a bit sore still. She missed last session due to "business" and was gone the week before for vacation.   PAIN:  Are you having pain? Yes  NPRS scale: 2/10 Lt shoulder Pain location: Lt shoulder and wrist intermittently Pain description: shoulder: sharp with sudden motions; wrist: dull at times Aggravating factors: sudden motions, weight bearing Relieving factors: rest, stretches, heat, ice, etc.    OBJECTIVE:  TODAY'S TREATMENT:  11/15/21:OT rev finger stretches and wrist, then OT focuses on manual shoulder stretches today with edu to add to HEP. See below for updated list. She tolerates sh stretches in supine  very well after multiple trials and attempts. She feels looser and no pain at end.   Exercises Seated Composite Thumb Flexion PROM - 2-3 x daily - 3-5 reps - 15 sec  hold Seated Thumb and Index Finger Webspace Stretch - 2-3 x daily - 3-5 reps - 15 sec hold Resisted Finger Abduction - Index and Middle - 2-3 x daily - 5-10 reps - 2-3 sec hold Towel Roll Grip with Forearm in Neutral - 2-3 x daily - 3-5 reps - 10 sec hold Thumb Opposition - 2-3 x daily - 10 reps Seated Wrist Flexion Stretch - 2-3 x daily - 3 reps - 15 hold Wrist Extension Stretch Pronated - 2-3 x daily - 3 reps - 15 hold Sleeper Stretch - 3-4 x daily - 1 sets - 3-5 reps - 15 sec hold Seated Shoulder Flexion Table Top Stretch - 3-4 x daily - 1 sets - 3-5 reps - 15 hold Supine Shoulder Flexion AAROM with Hands Clasped - 3-4 x daily - 1 sets - 3-5 reps - 15 hold  11/08/21: Pt reviews HEP for wrist and performs AROM with overpressure in fluidotherapy for 5 mins, stating looser at end. OT then performs manual therapy and IASTM to wrist and hand (for thumb OA as well).  After gentle joint oscillations, manual stretches with patient positioning, etc., wrist ext improves from 39* to 45* post manual therapy. Also discusses shoulder HEP and she states doing flexion A/ROM, IR stretches behind back and cross body posterior capsule stretches. We will focus on shoulder next session.   10/27/21: Visit was a reassessment today, she seems to have made progress in terms of motion, strength, pain levels, etc., but is still lacking necessary motion and strength to perform all I/ADLs. She reviews IR shoulder stretches, was educated on new prayer wrist ext stretches, also "stable-c" management for thumb OA and deformation (webspace stretches, thumb palmer abduction stretches, grip in "c" position, 1st dorsal interossei strength). Additionally she reviews finger and wrist stretches and performs with some cues needed. She will need a more comprehensive, updated HEP in next session, but was given a limited handout today.   She also performs grip, A/ROM at shoulder, wrist, hand for reassessment measures (see below assessment  sections). She discusses functional activities and issues while performing Quick DASH for current functional assessment (scores a 25% impairment today, has issues washing back, holding knife, shoulder pain, etc.).     PATIENT EDUCATION: Education details: see above tx section Person educated: Patient Education method: Explanation, Demonstration, Tactile cues, Verbal cues, and Handouts Education comprehension: verbalized understanding, returned demonstration, verbal cues required, tactile cues required, and needs further education   HOME EXERCISE PROGRAM Access Code: [facility] URL: [email or web address] Prepared by: Benito Mccreedy   OBJECTIVE MEASURES       10/27/21:  (*ROM is A/ROM unless otherwise stated)    Lt grip strength: 23# (compared to 60# Rt hand)  Lt wrist flex: improved to 12* now   Lt writ ext: improved to 40* now  Lt sh flex: 102*   Lt sh abd: 120*  Lt sh ER: 44*  Lt sh IR: 60*  Gap from pad MF to [facility]: 2cm  Lt FA supination: 70*  Functional Measure: Quick DASH 25% impairment today   OT Short Term Goals - All Met as of 08/31/21 1228       OT SHORT TERM GOAL #1   Title Pt will be Mod I splinting use, care and precautions left UE    Time  4    Period Weeks    Status Achieved    Target Date 09/01/21      OT SHORT TERM GOAL #2   Title Pt will be mod I initial HEP for L wrist and hand as seen by mod I ability to perform in the clinic    Time 4    Period Weeks    Status Achieved    Target Date 09/01/21      OT SHORT TERM GOAL #3   Title Pt will be Mod I edema control techniques L UE as seen by pt ability to state 2-3 techniques w/ 1 vc or less    Time 4    Period Weeks    Status Achieved    Target Date 09/01/21              OT Long Term Goals - UPDATED 10/27/21      OT LONG TERM GOAL #1   Title Pt will be Mod I upgraded HEP L wrist/hand    Time 8    Period Weeks    Status Achieved    Target Date 10/26/21      OT LONG TERM GOAL #2    Title Pt will be Mod I scar management as seen by I ability to perform in the clinic    Time 8    Period Weeks    Status Achieved    Target Date 09/29/21      OT LONG TERM GOAL #3   Title Pt will be Mod I ADL's and light functional use of left as non dominant hand as per pt report of ability to cut food, dress, bathe etc w/o assistance or simulation in the clinic    Time 8    Period Weeks    Status Met 10/27/21   Target Date 10/26/21      OT LONG TERM GOAL #4   Title Pt will demonstrate improved A/ROM of digits left as seen by ability to oppose thumb to base of small finger and make a flat fist.    Time 8    Period Weeks    Status Achieved    Target Date 10/26/21      OT LONG TERM GOAL #5   Title Pt will demonstrate decreaesd pain in left wrist as seen by pain rating as 2-3/10 or less during functional activity and simulated homemaking tasks    Time 8    Period Weeks    Status Achieved    Target Date 09/29/21        OT Long Term Goal #6  In 5 weeks, she will increase L grip strength from 23# to at least 30# for basic functional strength in home setting.   Status: New Goal Today 10/27/21 Target Date: 12/01/21   OT Long term Goal #7  In 5 weeks, she will increase Lt wrist ext from 40* to at least 55* for ability to hold objects in open palm.   Status: New Goal Today 10/27/21 Target Date: 12/01/21   OT long Term Goal #7 In 5 weeks, she will increase Lt shoulder flexion from 102* painfully, to 125* non-painful for functional reach into cupboards.   Status: New Goal Today 10/27/21 Target Date: 12/01/21   OT Long Term Goal #8 In 5 weeks, she will decrease functional impairment per Quick DASH from 25% to 10% or better, for quality of life improvement.   Status: New Goal Today 10/27/21 Target Date: 12/01/21  Plan -  UPDATED 10/27/21      Clinical Impression Statement 11/15/21: Pt improving with hand/wrist motion and now shoulder P/ROM and pain. Continue to manage all as pt  tolerates. Try to get more aggressive at wrist/fingers as tol   11/08/21: Pt did not lose much ground while on vacation, she doesn't seem to be hitting physical plateau despite her feeling as such. Keep going with POC.  10/27/21: Visit was a reassessment today, she has made excellent progress in terms of motion, strength, pain levels, etc., but is still lacking necessary motion and strength to perform all I/ADLs. She also needed new goals for emerging shoulder issue. Additionally, she has obvious b/l thumb CMC OA that could be addressed, but she wishes to finish wrist and shoulder therapy first.       OT Frequency 2xweek for 4 weeks (tapering as appropriate) starting, 11/08/21, after 1 week hold in therapy.     OT Duration 4 additional weeks after 1 week hold (until 12/01/21)    OT Treatment/Interventions Self-care/ADL training; Fluidtherapy; Splinting; Therapeutic activities; Therapeutic exercise; Cryotherapy; Moist Heat; Passive range of motion; Manual Therapy; Patient/family education     Plan 11/15/21: Consider dynamic bracing to increase wrist P/ROM as needed, check new shoulder exercises and try to synthesize entire HEP into her lifestyle .   11/08/21: for wrist/hand try ktape, add thumb abd rubber band, also focus on shoulder issues.   10/27/21: Pt will move to 2/week visits for at least the next 2 weeks to focus on wrist motion, strength; finger closure; shoulder motion strength and decreasing pain. Give comprehensive, updated HEP for reach goal group.       Consulted and Agree with Plan of Care Patient       Benito Mccreedy, OTR/L, CHT 11/15/2021, 3:57 PM

## 2021-11-17 ENCOUNTER — Encounter: Payer: Medicare Other | Admitting: Rehabilitative and Restorative Service Providers"

## 2021-11-18 ENCOUNTER — Encounter: Payer: Medicare Other | Admitting: Rehabilitative and Restorative Service Providers"

## 2021-11-19 ENCOUNTER — Ambulatory Visit (INDEPENDENT_AMBULATORY_CARE_PROVIDER_SITE_OTHER): Payer: Medicare Other | Admitting: Rehabilitative and Restorative Service Providers"

## 2021-11-19 ENCOUNTER — Other Ambulatory Visit: Payer: Self-pay

## 2021-11-19 DIAGNOSIS — R278 Other lack of coordination: Secondary | ICD-10-CM

## 2021-11-19 DIAGNOSIS — M25532 Pain in left wrist: Secondary | ICD-10-CM

## 2021-11-19 DIAGNOSIS — M25612 Stiffness of left shoulder, not elsewhere classified: Secondary | ICD-10-CM

## 2021-11-19 DIAGNOSIS — M79642 Pain in left hand: Secondary | ICD-10-CM | POA: Diagnosis not present

## 2021-11-19 DIAGNOSIS — M25642 Stiffness of left hand, not elsewhere classified: Secondary | ICD-10-CM

## 2021-11-19 DIAGNOSIS — M6281 Muscle weakness (generalized): Secondary | ICD-10-CM

## 2021-11-19 DIAGNOSIS — R601 Generalized edema: Secondary | ICD-10-CM | POA: Diagnosis not present

## 2021-11-19 NOTE — Therapy (Signed)
OUTPATIENT OCCUPATIONAL THERAPY TREATMENT NOTE    Patient Name: Robin York MRN: [id number] DOB:1950-11-11, 71 y.o., female Today's Date: 11/19/2021  PCP: Concepcion Elk, MD REFERRING PROVIDER: Concepcion Elk, MD   Past Medical History:  Diagnosis Date   Cancer [facility]) 2012   left breast cancer, stage I invasive lobular carcinoma   Frequent UTI    GERD (gastroesophageal reflux disease)    HLD (hyperlipidemia)    Hypothyroidism 04/21/2014   Osteoporosis    PAT (paroxysmal atrial tachycardia) (HCC)    no meds, no current problem   PONV (postoperative nausea and vomiting)    Past Surgical History:  Procedure Laterality Date   BREAST SURGERY Bilateral 04/2011   bilateral mastectomies for left breast cancer with reconstruction   COLONOSCOPY  07/2020   FINGER SURGERY Left 07/2020   left hand -index finger   LAPAROSCOPIC ASSISTED VAGINAL HYSTERECTOMY     OPEN REDUCTION INTERNAL FIXATION (ORIF) DISTAL RADIAL FRACTURE Left 07/20/2021   Procedure: OPEN REDUCTION INTERNAL FIXATION (ORIF) LEFT DISTAL RADIAL FRACTURE;  Surgeon: Sherilyn Cooter, MD;  Location: [place];  Service: Orthopedics;  Laterality: Left;   Patient Active Problem List   Diagnosis Date Noted   Closed fracture of left distal radius 07/16/2021   Cognitive complaints with normal neuropsychological exam 03/18/2021   Encounter for monitoring denosumab therapy 11/02/2017   Bilateral impacted cerumen 10/03/2017   Frequent UTI 08/31/2017   Encounter for monitoring aromatase inhibitor therapy 12/16/2015   Palpitation 08/18/2015   Encounter for follow-up surveillance of breast cancer 06/09/2015   Abnormal auditory perception 06/27/2014   Atrial premature depolarization 06/27/2014   Atrophic vaginitis 06/27/2014   Chronic cystitis 06/27/2014   Hyperlipemia 06/27/2014   Vitamin D deficiency 06/27/2014   Age-related osteoporosis without current pathological fracture 04/21/2014   GERD (gastroesophageal reflux disease)  04/21/2014   Hypothyroidism 04/21/2014   Malignant neoplasm of upper-outer quadrant of left breast in female, estrogen receptor positive ([place]) 04/21/2014   Osteoporosis 04/21/2014   Primary localized osteoarthrosis of hand 02/23/2013    ONSET DATE: DOI: 06/17/21, DOS: 07/20/22  REFERRING DIAG: L93.790W (ICD-10-CM) - Other closed intra-articular fracture of distal end of left radius, initial encounter  THERAPY DIAG:  Stiffness of left shoulder, not elsewhere classified  Generalized edema  Pain in left hand  Stiffness of left hand, not elsewhere classified  Muscle weakness (generalized)  Other lack of coordination  Pain in left wrist   PERTINENT HISTORY: Pt fell in closet, fx Lt distal radius, getting ORIF on 07/20/22. 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   Also history of past: Lt finger sx, hx of breast CA in remission now.  PRECAUTIONS: none now more than 12 weeks out from wrist sx  SUBJECTIVE: She states doing much better now, not in much pain, been doing HEP 2-4 x day as able. Reach back is still difficult and feels weaker still.    PAIN:  Are you having pain? Yes  NPRS scale: 1/10 Lt shoulder Pain location: Lt shoulder and wrist intermittently Pain description: shoulder: sharp with sudden motions; wrist: dull at times Aggravating factors: sudden motions, weight bearing Relieving factors: rest, stretches, heat, ice, etc.    OBJECTIVE:  TODAY'S TREATMENT:  11/19/21: While pt's shoulder on MH, OT performs manual stretches to wrist in 4 planes with joint mobs and distraction as tolerated. She feels healthy stretches and reviews HEP in this way. She then reviews  shoulder stretches, also reviewing sleeper stretch, adding doorway stretches and cross-body in front and behind back. She does well with all of these, feels looser, no pain.  Lastly she  starts light PRE with 3# for bicep curls and tricep kickbacks, also b/l sh flexion holding 3# weight to strengthen whole arm, increase fnl ability. These were tolerated well at wrist as well. (MD was present for some of session & this time was not billed for)   11/15/21:OT rev finger stretches and wrist, then OT focuses on manual shoulder stretches today with edu to add to HEP. See below for updated list. She tolerates sh stretches in supine very well after multiple trials and attempts. She feels looser and no pain at end.   Exercises Seated Composite Thumb Flexion PROM - 2-3 x daily - 3-5 reps - 15 sec hold Seated Thumb and Index Finger Webspace Stretch - 2-3 x daily - 3-5 reps - 15 sec hold Resisted Finger Abduction - Index and Middle - 2-3 x daily - 5-10 reps - 2-3 sec hold Towel Roll Grip with Forearm in Neutral - 2-3 x daily - 3-5 reps - 10 sec hold Thumb Opposition - 2-3 x daily - 10 reps Seated Wrist Flexion Stretch - 2-3 x daily - 3 reps - 15 hold Wrist Extension Stretch Pronated - 2-3 x daily - 3 reps - 15 hold Sleeper Stretch - 3-4 x daily - 1 sets - 3-5 reps - 15 sec hold Seated Shoulder Flexion Table Top Stretch - 3-4 x daily - 1 sets - 3-5 reps - 15 hold Supine Shoulder Flexion AAROM with Hands Clasped - 3-4 x daily - 1 sets - 3-5 reps - 15 hold   PATIENT EDUCATION: Education details: see above tx section Person educated: Patient Education method: Explanation, Demonstration, Tactile cues, Verbal cues, and Handouts Education comprehension: verbalized understanding, returned demonstration, verbal cues required, tactile cues required, and needs further education   HOME EXERCISE PROGRAM Access Code: [facility] URL: [email or web address] Prepared by: Benito Mccreedy   OBJECTIVE MEASURES       10/27/21:  (*ROM is A/ROM unless otherwise stated)    Lt grip strength: 23# (compared to 60# Rt hand)  Lt wrist flex: improved to 15* now   Lt writ ext: improved to 40*  now  Lt sh flex: 102*   Lt sh abd: 120*  Lt sh ER: 62*  Lt sh IR: 42*  Gap from pad MF to [facility]: 2cm  Lt FA supination: 70*  Functional Measure: Quick DASH 25% impairment today   OT Short Term Goals - All Met as of 08/31/21 1228       OT SHORT TERM GOAL #1   Title Pt will be Mod I splinting use, care and precautions left UE    Time 4    Period Weeks    Status Achieved    Target Date 09/01/21      OT SHORT TERM GOAL #2   Title Pt will be mod I initial HEP for L wrist and hand as seen by mod I ability to perform in the clinic    Time 4    Period Weeks    Status Achieved    Target Date 09/01/21      OT SHORT TERM GOAL #3   Title Pt will be Mod I edema control techniques L UE as seen by pt ability to state 2-3 techniques w/ 1 vc or less    Time 4    Period Weeks  Status Achieved    Target Date 09/01/21              OT Long Term Goals - UPDATED 10/27/21      OT LONG TERM GOAL #1   Title Pt will be Mod I upgraded HEP L wrist/hand    Time 8    Period Weeks    Status Achieved    Target Date 10/26/21      OT LONG TERM GOAL #2   Title Pt will be Mod I scar management as seen by I ability to perform in the clinic    Time 8    Period Weeks    Status Achieved    Target Date 09/29/21      OT LONG TERM GOAL #3   Title Pt will be Mod I ADL's and light functional use of left as non dominant hand as per pt report of ability to cut food, dress, bathe etc w/o assistance or simulation in the clinic    Time 8    Period Weeks    Status Met 10/27/21   Target Date 10/26/21      OT LONG TERM GOAL #4   Title Pt will demonstrate improved A/ROM of digits left as seen by ability to oppose thumb to base of small finger and make a flat fist.    Time 8    Period Weeks    Status Achieved    Target Date 10/26/21      OT LONG TERM GOAL #5   Title Pt will demonstrate decreaesd pain in left wrist as seen by pain rating as 2-3/10 or less during functional activity and simulated homemaking  tasks    Time 8    Period Weeks    Status Achieved    Target Date 09/29/21        OT Long Term Goal #6  In 5 weeks, she will increase L grip strength from 23# to at least 30# for basic functional strength in home setting.   Status: New Goal Today 10/27/21 Target Date: 12/01/21   OT Long term Goal #7  In 5 weeks, she will increase Lt wrist ext from 40* to at least 55* for ability to hold objects in open palm.   Status: New Goal Today 10/27/21 Target Date: 12/01/21   OT long Term Goal #7 In 5 weeks, she will increase Lt shoulder flexion from 102* painfully, to 125* non-painful for functional reach into cupboards.   Status: New Goal Today 10/27/21 Target Date: 12/01/21   OT Long Term Goal #8 In 5 weeks, she will decrease functional impairment per Quick DASH from 25% to 10% or better, for quality of life improvement.   Status: New Goal Today 10/27/21 Target Date: 12/01/21            Plan -  UPDATED 10/27/21      Clinical Impression Statement 11/19/21: Pt doing very well, wrist not swollen but remains tight. Shoulder improving and thumb is well managed.   11/15/21: Pt improving with hand/wrist motion and now shoulder P/ROM and pain. Continue to manage all as pt tolerates. Try to get more aggressive at wrist/fingers as tol   11/08/21: Pt did not lose much ground while on vacation, she doesn't seem to be hitting physical plateau despite her feeling as such. Keep going with POC.     OT Frequency 2xweek for 4 weeks (tapering as appropriate) starting, 11/08/21, after 1 week hold in therapy.     OT Duration 4  additional weeks after 1 week hold (until 12/01/21)    OT Treatment/Interventions Self-care/ADL training; Fluidtherapy; Splinting; Therapeutic activities; Therapeutic exercise; Cryotherapy; Moist Heat; Passive range of motion; Manual Therapy; Patient/family education     Plan 11/19/21: Reassess next week to determine need for additional therapy and perhaps static progressive orthotic for stubborn wrist  flexion and ext ROM.   11/15/21: Consider dynamic bracing to increase wrist P/ROM as needed, check new shoulder exercises and try to synthesize entire HEP into her lifestyle .     Consulted and Agree with Plan of Care Patient       Benito Mccreedy, OTR/L, CHT 11/19/2021, 12:31 PM

## 2021-11-22 ENCOUNTER — Encounter: Payer: Self-pay | Admitting: Orthopedic Surgery

## 2021-11-22 ENCOUNTER — Encounter: Payer: Self-pay | Admitting: Rehabilitative and Restorative Service Providers"

## 2021-11-22 ENCOUNTER — Other Ambulatory Visit: Payer: Self-pay

## 2021-11-22 ENCOUNTER — Ambulatory Visit (INDEPENDENT_AMBULATORY_CARE_PROVIDER_SITE_OTHER): Payer: Medicare Other | Admitting: Rehabilitative and Restorative Service Providers"

## 2021-11-22 ENCOUNTER — Ambulatory Visit: Payer: Self-pay

## 2021-11-22 ENCOUNTER — Ambulatory Visit: Payer: Medicare Other | Admitting: Orthopedic Surgery

## 2021-11-22 DIAGNOSIS — R278 Other lack of coordination: Secondary | ICD-10-CM

## 2021-11-22 DIAGNOSIS — S52572A Other intraarticular fracture of lower end of left radius, initial encounter for closed fracture: Secondary | ICD-10-CM

## 2021-11-22 DIAGNOSIS — M6281 Muscle weakness (generalized): Secondary | ICD-10-CM

## 2021-11-22 DIAGNOSIS — M25642 Stiffness of left hand, not elsewhere classified: Secondary | ICD-10-CM

## 2021-11-22 DIAGNOSIS — R601 Generalized edema: Secondary | ICD-10-CM

## 2021-11-22 DIAGNOSIS — M25532 Pain in left wrist: Secondary | ICD-10-CM

## 2021-11-22 DIAGNOSIS — M25612 Stiffness of left shoulder, not elsewhere classified: Secondary | ICD-10-CM

## 2021-11-22 DIAGNOSIS — M25512 Pain in left shoulder: Secondary | ICD-10-CM

## 2021-11-22 DIAGNOSIS — M79642 Pain in left hand: Secondary | ICD-10-CM

## 2021-11-22 NOTE — Therapy (Signed)
OUTPATIENT OCCUPATIONAL THERAPY TREATMENT & PROGRESS NOTE    Patient Name: Robin York MRN: [id number] DOB:1951/04/07, 71 y.o., female Today's Date: 11/22/2021  PCP: Concepcion Elk, MD REFERRING PROVIDER: Concepcion Elk, MD  Progress Note  Reporting Period 10/27/21 to 12/01/21.  See note below for Objective Data and Assessment of Progress/Goals.      Past Medical History:  Diagnosis Date   Cancer ([place]) 2012   left breast cancer, stage I invasive lobular carcinoma   Frequent UTI    GERD (gastroesophageal reflux disease)    HLD (hyperlipidemia)    Hypothyroidism 04/21/2014   Osteoporosis    PAT (paroxysmal atrial tachycardia) (HCC)    no meds, no current problem   PONV (postoperative nausea and vomiting)    Past Surgical History:  Procedure Laterality Date   BREAST SURGERY Bilateral 04/2011   bilateral mastectomies for left breast cancer with reconstruction   COLONOSCOPY  07/2020   FINGER SURGERY Left 07/2020   left hand -index finger   LAPAROSCOPIC ASSISTED VAGINAL HYSTERECTOMY     OPEN REDUCTION INTERNAL FIXATION (ORIF) DISTAL RADIAL FRACTURE Left 07/20/2021   Procedure: OPEN REDUCTION INTERNAL FIXATION (ORIF) LEFT DISTAL RADIAL FRACTURE;  Surgeon: Sherilyn Cooter, MD;  Location: [place];  Service: Orthopedics;  Laterality: Left;   Patient Active Problem List   Diagnosis Date Noted   Closed fracture of left distal radius 07/16/2021   Cognitive complaints with normal neuropsychological exam 03/18/2021   Encounter for monitoring denosumab therapy 11/02/2017   Bilateral impacted cerumen 10/03/2017   Frequent UTI 08/31/2017   Encounter for monitoring aromatase inhibitor therapy 12/16/2015   Palpitation 08/18/2015   Encounter for follow-up surveillance of breast cancer 06/09/2015   Abnormal auditory perception 06/27/2014   Atrial premature depolarization 06/27/2014   Atrophic vaginitis 06/27/2014   Chronic cystitis 06/27/2014   Hyperlipemia 06/27/2014   Vitamin D  deficiency 06/27/2014   Age-related osteoporosis without current pathological fracture 04/21/2014   GERD (gastroesophageal reflux disease) 04/21/2014   Hypothyroidism 04/21/2014   Malignant neoplasm of upper-outer quadrant of left breast in female, estrogen receptor positive ([place]) 04/21/2014   Osteoporosis 04/21/2014   Primary localized osteoarthrosis of hand 02/23/2013    ONSET DATE: DOI: 06/17/21, DOS: 07/20/22  REFERRING DIAG: P53.614E (ICD-10-CM) - Other closed intra-articular fracture of distal end of left radius, initial encounter  THERAPY DIAG:  Generalized edema  Stiffness of left shoulder, not elsewhere classified  Stiffness of left hand, not elsewhere classified  Muscle weakness (generalized)  Pain in left hand  Other lack of coordination  Acute pain of left shoulder  Pain in left wrist   PERTINENT HISTORY: Pt fell in closet, fx Lt distal radius, getting ORIF on 07/20/22. 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   Also history of past: Lt finger sx, hx of breast CA in remission now.  PRECAUTIONS: none now more than 12 weeks out from wrist sx  SUBJECTIVE: She states doing well working on wrist extension a lot/   PAIN:  Are you having pain? Yes  NPRS scale: 0/10 Lt shoulder Pain location: Lt shoulder and wrist intermittently Pain description: shoulder: sharp with sudden motions; wrist: dull at times Aggravating factors: sudden motions, weight bearing Relieving factors: rest, stretches, heat, ice, etc.    OBJECTIVE:  TODAY'S TREATMENT:  11/22/21:Pt performs A/ROM at shoulder, wrist, hand in multiple planes and reviews HEP for shoulder, wrist and hand for motion, strength and  fnl use. She states understanding but will need f/u edu and training with OT.  She also discusses home issues/and fnl activities. Still having issues with golf,  small issues with containers, utensils (OA/weakness), for which therapy still warranted. She states agreeing with POC and wishes to continue 1/week.    11/19/21: While pt's shoulder on MH, OT performs manual stretches to wrist in 4 planes with joint mobs and distraction as tolerated. She feels healthy stretches and reviews HEP in this way. She then reviews shoulder stretches, also reviewing sleeper stretch, adding doorway stretches and cross-body in front and behind back. She does well with all of these, feels looser, no pain.  Lastly she starts light PRE with 3# for bicep curls and tricep kickbacks, also b/l sh flexion holding 3# weight to strengthen whole arm, increase fnl ability. These were tolerated well at wrist as well. (MD was present for some of session & this time was not billed for)    PATIENT EDUCATION: Education details: see above tx section Person educated: Patient Education method: Explanation, Demonstration, Tactile cues, Verbal cues, and Handouts Education comprehension: verbalized understanding, returned demonstration, verbal cues required, tactile cues required, and needs further education   HOME EXERCISE PROGRAM Access Code: [facility] URL: [email or web address] Prepared by: Benito Mccreedy   OBJECTIVE MEASURES       11/22/21:  Lt grip strength: 28# (compared to 57# Rt hand)  Lt wrist flex: improved to 41* now   Lt writ ext: improved to 32* now  Lt sh flex: 125*  Lt sh abd: 79*  Lt sh ER: 12*  Lt sh IR: 5*  Gap from pad MF to [facility]: 1.8cm  Lt FA supination: 75*  Functional Measure: Quick DASH 13% impairment today  10/27/21:  (*ROM is A/ROM unless otherwise stated)    Lt grip strength: 23# (compared to 60# Rt hand)  Lt wrist flex: improved to 29* now   Lt writ ext: improved to 40* now  Lt sh flex: 102*   Lt sh abd: 120*  Lt sh ER: 97*  Lt sh IR: 52*  Gap from pad MF to [facility]: 2cm  Lt FA supination: 70*  Functional Measure: Quick DASH 25% impairment  today   OT Short Term Goals - All Met as of 08/31/21 1228       OT SHORT TERM GOAL #1   Title Pt will be Mod I splinting use, care and precautions left UE    Time 4    Period Weeks    Status Achieved    Target Date 09/01/21      OT SHORT TERM GOAL #2   Title Pt will be mod I initial HEP for L wrist and hand as seen by mod I ability to perform in the clinic    Time 4    Period Weeks    Status Achieved    Target Date 09/01/21      OT SHORT TERM GOAL #3   Title Pt will be Mod I edema control techniques L UE as seen by pt ability to state 2-3 techniques w/ 1 vc or less    Time 4    Period Weeks    Status Achieved    Target Date 09/01/21              OT Long Term Goals - UPDATED 11/22/21      OT LONG TERM GOAL #1   Title Pt will be Mod I upgraded HEP L wrist/hand    Time 8  Period Weeks    Status Achieved    Target Date 10/26/21      OT LONG TERM GOAL #2   Title Pt will be Mod I scar management as seen by I ability to perform in the clinic    Time 8    Period Weeks    Status Achieved    Target Date 09/29/21      OT LONG TERM GOAL #3   Title Pt will be Mod I ADL's and light functional use of left as non dominant hand as per pt report of ability to cut food, dress, bathe etc w/o assistance or simulation in the clinic    Time 8    Period Weeks    Status Met 10/27/21   Target Date 10/26/21      OT LONG TERM GOAL #4   Title Pt will demonstrate improved A/ROM of digits left as seen by ability to oppose thumb to base of small finger and make a flat fist.    Time 8    Period Weeks    Status Achieved    Target Date 10/26/21      OT LONG TERM GOAL #5   Title Pt will demonstrate decreaesd pain in left wrist as seen by pain rating as 2-3/10 or less during functional activity and simulated homemaking tasks    Time 8    Period Weeks    Status Achieved    Target Date 09/29/21        OT Long Term Goal #6  In 5 weeks, she will increase L grip strength from 23# to at  least 30# for basic functional strength in home setting.   Status: Progressing Well 28# 11/22/21 Target Date: 12/01/21   OT Long term Goal #7  In 5 weeks, she will increase Lt wrist ext from 40* to at least 55* for ability to hold objects in open palm.   Status: Progressing 49* on 11/22/21 Target Date: 12/01/21   OT long Term Goal #7 In 5 weeks, she will increase Lt shoulder flexion from 102* painfully, to 125* non-painful for functional reach into cupboards.   Status: MET 11/22/21 Target Date: 12/01/21   OT Long Term Goal #8 In 5 weeks, she will decrease functional impairment per Quick DASH from 25% to 10% or better, for quality of life improvement.   Status: Improving- down to 13% now Target Date: 12/24/21       Plan -  UPDATED 11/22/21      Clinical Impression Statement 11/22/21: Pt has made improvements in key motions at shoulder, wrist, grasp, but still has major limitations in sh abd, wrist flex/ext, weakness in sh/hand for which therapy is still warranted.    11/19/21: Pt doing very well, wrist not swollen but remains tight. Shoulder improving and thumb is well managed.     OT Frequency 1xweek for 4 additional weeks (through 12/24/21)     OT Duration 4 additional weeks (through 12/24/21)     OT Treatment/Interventions Self-care/ADL training; Fluidtherapy; Splinting; Therapeutic activities; Therapeutic exercise; Cryotherapy; Moist Heat; Passive range of motion; Manual Therapy; Patient/family education     Plan 11/22/21: We will continue 4 weeks 1 x week, to ensure shoulder abd, wrist flex/ext, grip all improving, to provide manual therapy support, to review thumb OA issues/stable-c and potentially make orthotic if needed.   11/19/21: Reassess next week to determine need for additional therapy and perhaps static progressive orthotic for stubborn wrist flexion and ext ROM.     Consulted  and Agree with Plan of Care Patient       Benito Mccreedy, OTR/L, CHT 11/22/2021, 8:36 AM

## 2021-11-22 NOTE — Progress Notes (Signed)
? ?Office Visit Note ?  ?Patient: Robin York           ?Date of Birth: 1950-10-26           ?MRN: [id number] ?Visit Date: 11/22/2021 ?             ?Requested by: Concepcion Elk, MD ?[address] ?[place],  [place] [address] ?PCP: Concepcion Elk, MD ? ? ?Assessment & Plan: ?Visit Diagnoses:  ?1. Other closed intra-articular fracture of distal end of left radius, initial encounter   ? ? ?Plan: Patient is approximately 18 weeks s/p ORIF left distal radius.  She is still working with hand therapy on ROM.  She is continuing to see improvement.  X-rays show a healed distal radius fracture with appropriate plate position. I can see her back in another month to see how she's progressed with therapy.  ? ?Follow-Up Instructions: No follow-ups on file.  ? ?Orders:  ?Orders Placed This Encounter  ?Procedures  ? XR Wrist Complete Left  ? ?No orders of the defined types were placed in this encounter. ? ? ? ? Procedures: ?No procedures performed ? ? ?Clinical Data: ?No additional findings. ? ? ?Subjective: ?Chief Complaint  ?Patient presents with  ? Left Wrist - Follow-up  ? ? ?This is a 71 year old right-hand-dominant female who presents for follow-up of a left distal radius fracture status post ORIF in November 1 of last year.  She doing very well.  She is still working with hand therapy to work on range of motion of the wrist, particularly flexion, and strengthening of her grip.  She is overall happy with her progress so far.  She is returned to some light golfing including chipping and putting.  She denies any numbness or paresthesias.  She does have pain at the left thumb [facility] joint but is not bothersome enough for her to address it at this point. ? ? ?Review of Systems ? ? ?Objective: ?Vital Signs: There were no vitals taken for this visit. ? ?Physical Exam ? ?Right Hand Exam  ? ?Tenderness  ?The patient is experiencing no tenderness.  ? ?Range of Motion  ?Wrist  ?Right wrist extension: 50.  ?Right wrist flexion: 35.  ? ?Other   ?Erythema: absent ?Sensation: normal ?Pulse: present ? ?Comments:  Wrist ROM 50/35 (50/55 on contralateral side).  No pain.  CMC deformity with palmar abduction contracture.  ? ? ? ? ?Specialty Comments:  ?No specialty comments available. ? ?Imaging: ?No results found. ? ? ?PMFS History: ?Patient Active Problem List  ? Diagnosis Date Noted  ? Closed fracture of left distal radius 07/16/2021  ? Cognitive complaints with normal neuropsychological exam 03/18/2021  ? Encounter for monitoring denosumab therapy 11/02/2017  ? Bilateral impacted cerumen 10/03/2017  ? Frequent UTI 08/31/2017  ? Encounter for monitoring aromatase inhibitor therapy 12/16/2015  ? Palpitation 08/18/2015  ? Encounter for follow-up surveillance of breast cancer 06/09/2015  ? Abnormal auditory perception 06/27/2014  ? Atrial premature depolarization 06/27/2014  ? Atrophic vaginitis 06/27/2014  ? Chronic cystitis 06/27/2014  ? Hyperlipemia 06/27/2014  ? Vitamin D deficiency 06/27/2014  ? Age-related osteoporosis without current pathological fracture 04/21/2014  ? GERD (gastroesophageal reflux disease) 04/21/2014  ? Hypothyroidism 04/21/2014  ? Malignant neoplasm of upper-outer quadrant of left breast in female, estrogen receptor positive ([place]) 04/21/2014  ? Osteoporosis 04/21/2014  ? Primary localized osteoarthrosis of hand 02/23/2013  ? ?Past Medical History:  ?Diagnosis Date  ? Cancer [facility]) 2012  ? left breast cancer, stage I invasive  lobular carcinoma  ? Frequent UTI   ? GERD (gastroesophageal reflux disease)   ? HLD (hyperlipidemia)   ? Hypothyroidism 04/21/2014  ? Osteoporosis   ? PAT (paroxysmal atrial tachycardia) ([place])   ? no meds, no current problem  ? PONV (postoperative nausea and vomiting)   ?  ?History reviewed. No pertinent family history.  ?Past Surgical History:  ?Procedure Laterality Date  ? BREAST SURGERY Bilateral 04/2011  ? bilateral mastectomies for left breast cancer with reconstruction  ? COLONOSCOPY  07/2020  ? FINGER  SURGERY Left 07/2020  ? left hand -index finger  ? LAPAROSCOPIC ASSISTED VAGINAL HYSTERECTOMY    ? OPEN REDUCTION INTERNAL FIXATION (ORIF) DISTAL RADIAL FRACTURE Left 07/20/2021  ? Procedure: OPEN REDUCTION INTERNAL FIXATION (ORIF) LEFT DISTAL RADIAL FRACTURE;  Surgeon: Sherilyn Cooter, MD;  Location: [place];  Service: Orthopedics;  Laterality: Left;  ? ?Social History  ? ?Occupational History  ? Not on file  ?Tobacco Use  ? Smoking status: Never  ? Smokeless tobacco: Never  ?Vaping Use  ? Vaping Use: Never used  ?Substance and Sexual Activity  ? Alcohol use: Yes  ?  Alcohol/week: 7.0 standard drinks  ?  Types: 7 Glasses of wine per week  ? Drug use: No  ? Sexual activity: Yes  ?  Birth control/protection: None  ?  Comment: Hysterectomy  ? ? ? ? ? ? ?

## 2021-11-26 ENCOUNTER — Other Ambulatory Visit: Payer: Self-pay | Admitting: Orthopedic Surgery

## 2021-11-29 ENCOUNTER — Other Ambulatory Visit: Payer: Self-pay

## 2021-11-29 ENCOUNTER — Ambulatory Visit (INDEPENDENT_AMBULATORY_CARE_PROVIDER_SITE_OTHER): Payer: Medicare Other | Admitting: Rehabilitative and Restorative Service Providers"

## 2021-11-29 DIAGNOSIS — R278 Other lack of coordination: Secondary | ICD-10-CM

## 2021-11-29 DIAGNOSIS — M6281 Muscle weakness (generalized): Secondary | ICD-10-CM | POA: Diagnosis not present

## 2021-11-29 DIAGNOSIS — R601 Generalized edema: Secondary | ICD-10-CM | POA: Diagnosis not present

## 2021-11-29 DIAGNOSIS — M25642 Stiffness of left hand, not elsewhere classified: Secondary | ICD-10-CM

## 2021-11-29 DIAGNOSIS — M25612 Stiffness of left shoulder, not elsewhere classified: Secondary | ICD-10-CM | POA: Diagnosis not present

## 2021-11-29 DIAGNOSIS — M25532 Pain in left wrist: Secondary | ICD-10-CM

## 2021-11-29 DIAGNOSIS — M79642 Pain in left hand: Secondary | ICD-10-CM

## 2021-11-29 DIAGNOSIS — M25512 Pain in left shoulder: Secondary | ICD-10-CM

## 2021-11-29 NOTE — Therapy (Signed)
°OUTPATIENT OCCUPATIONAL THERAPY TREATMENT NOTE  ° ° °Patient Name: Robin York °MRN: [id number] °DOB:11/06/1950, 71 y.o., female °Today's Date: 11/29/2021 ° °PCP: Ybanez, Jane, MD °REFERRING PROVIDER: Benfield, Charlie, MD °  ° °Past Medical History:  °Diagnosis Date  ° Cancer ([facility]) 2012  ° left breast cancer, stage I invasive lobular carcinoma  ° Frequent UTI   ° GERD (gastroesophageal reflux disease)   ° HLD (hyperlipidemia)   ° Hypothyroidism 04/21/2014  ° Osteoporosis   ° PAT (paroxysmal atrial tachycardia) ([facility])   ° no meds, no current problem  ° PONV (postoperative nausea and vomiting)   ° °Past Surgical History:  °Procedure Laterality Date  ° BREAST SURGERY Bilateral 04/2011  ° bilateral mastectomies for left breast cancer with reconstruction  ° COLONOSCOPY  07/2020  ° FINGER SURGERY Left 07/2020  ° left hand -index finger  ° LAPAROSCOPIC ASSISTED VAGINAL HYSTERECTOMY    ° OPEN REDUCTION INTERNAL FIXATION (ORIF) DISTAL RADIAL FRACTURE Left 07/20/2021  ° Procedure: OPEN REDUCTION INTERNAL FIXATION (ORIF) LEFT DISTAL RADIAL FRACTURE;  Surgeon: Benfield, Charlie, MD;  Location: [facility];  Service: Orthopedics;  Laterality: Left;  ° °Patient Active Problem List  ° Diagnosis Date Noted  ° Closed fracture of left distal radius 07/16/2021  ° Cognitive complaints with normal neuropsychological exam 03/18/2021  ° Encounter for monitoring denosumab therapy 11/02/2017  ° Bilateral impacted cerumen 10/03/2017  ° Frequent UTI 08/31/2017  ° Encounter for monitoring aromatase inhibitor therapy 12/16/2015  ° Palpitation 08/18/2015  ° Encounter for follow-up surveillance of breast cancer 06/09/2015  ° Abnormal auditory perception 06/27/2014  ° Atrial premature depolarization 06/27/2014  ° Atrophic vaginitis 06/27/2014  ° Chronic cystitis 06/27/2014  ° Hyperlipemia 06/27/2014  ° Vitamin D deficiency 06/27/2014  ° Age-related osteoporosis without current pathological fracture 04/21/2014  ° GERD (gastroesophageal reflux disease)  04/21/2014  ° Hypothyroidism 04/21/2014  ° Malignant neoplasm of upper-outer quadrant of left breast in female, estrogen receptor positive ([facility]) 04/21/2014  ° Osteoporosis 04/21/2014  ° Primary localized osteoarthrosis of hand 02/23/2013  ° ° °ONSET DATE: DOI: 06/17/21, DOS: 07/20/22 ° °REFERRING DIAG: S52.572A (ICD-10-CM) - Other closed intra-articular fracture of distal end of left radius, initial encounter ° °THERAPY DIAG:  °Stiffness of left hand, not elsewhere classified ° °Stiffness of left shoulder, not elsewhere classified ° °Generalized edema ° °Muscle weakness (generalized) ° °Pain in left hand ° °Other lack of coordination ° °Pain in left wrist ° °Acute pain of left shoulder ° ° °PERTINENT HISTORY: Pt fell in closet, fx Lt distal radius, getting ORIF on 07/20/22. 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  ° °Also history of past: Lt finger sx, hx of breast CA in remission now. ° °PRECAUTIONS: none now more than 12 weeks out from wrist sx ° °SUBJECTIVE:  °She states working on HEP, nothing new to report today.  ° ° °PAIN:  °Are you having pain? No °NPRS scale: 0/10 Lt shoulder °Pain location: Lt shoulder and wrist intermittently °Pain description: shoulder: sharp with sudden motions; wrist: dull at times °Aggravating factors: sudden motions, weight bearing °Relieving factors: rest, stretches, heat, ice, etc.  ° ° °OBJECTIVE: ° °TODAY'S TREATMENT:  °11/29/21: Pt uses UBE x5 mins on 3.5 resistance for muscular endurance. Then performs the following: °3# wrist flex x10 °3# wrist ext x10 °3# pro/sup x10 °20# rows 2x15 °10# bicep curls b/l 2 x10 °15# tricep push 2x15 °3# b/l sh flys x   15  ° °At end, she lies supine and OT does manual stretches to Lt sh in flex, abd, ER, IR, with MFR at tight spots in axilla. She tolerates well, with some soreness at end.   ° °11/22/21:Pt performs A/ROM at  shoulder, wrist, hand in multiple planes and reviews HEP for shoulder, wrist and hand for motion, strength and fnl use. She states understanding but will need f/u edu and training with OT.  She also discusses home issues/and fnl activities. Still having issues with golf, small issues with containers, utensils (OA/weakness), for which therapy still warranted. She states agreeing with POC and wishes to continue 1/week.   ° ° °PATIENT EDUCATION: °Education details: see above tx section °Person educated: Patient °Education method: Explanation, Demonstration, Tactile cues, Verbal cues, and Handouts °Education comprehension: verbalized understanding, returned demonstration, verbal cues required, tactile cues required, and needs further education ° ° °HOME EXERCISE PROGRAM °Access Code: [email or web address] °URL: [email or web address] °Prepared by: Nathanael Moore ° ° °OBJECTIVE MEASURES       11/22/21:  °Lt grip strength: 28# (compared to 57# Rt hand) ° Lt wrist flex: improved to 41* now  ° Lt writ ext: improved to 49* now ° Lt sh flex: 125* ° Lt sh abd: 88* ° Lt sh ER: 88* ° Lt sh IR: 54* ° Gap from pad MF to DPC: 1.8cm ° Lt FA supination: 75* ° Functional Measure: Quick DASH 13% impairment today ° °10/27/21:  °(*ROM is A/ROM unless otherwise stated)  ° ° Lt grip strength: 23# (compared to 60# Rt hand) ° Lt wrist flex: improved to 58* now  ° Lt writ ext: improved to 40* now ° Lt sh flex: 102*  ° Lt sh abd: 120* ° Lt sh ER: 74* ° Lt sh IR: 52* ° Gap from pad MF to DPC: 2cm ° Lt FA supination: 70* ° Functional Measure: Quick DASH 25% impairment today ° ° OT Short Term Goals - All Met as of 08/31/21 1228   ° °  ° OT SHORT TERM GOAL #1  ° Title Pt will be Mod I splinting use, care and precautions left UE   ° Time 4   ° Period Weeks   ° Status Achieved   ° Target Date 09/01/21   °  ° OT SHORT TERM GOAL #2  ° Title Pt will be mod I initial HEP for L wrist and hand as seen by mod I ability to perform in the clinic   ° Time 4   °  Period Weeks   ° Status Achieved   ° Target Date 09/01/21   °  ° OT SHORT TERM GOAL #3  ° Title Pt will be Mod I edema control techniques L UE as seen by pt ability to state 2-3 techniques w/ 1 vc or less   ° Time 4   ° Period Weeks   ° Status Achieved   ° Target Date 09/01/21   ° °  °  ° °  ° ° ° OT Long Term Goals - UPDATED 11/22/21  ° °  ° OT LONG TERM GOAL #1  ° Title Pt will be Mod I upgraded HEP L wrist/hand   ° Time 8   ° Period Weeks   ° Status Achieved   ° Target Date 10/26/21   °  ° OT LONG TERM GOAL #2  ° Title Pt will be Mod I scar management as seen by I ability to perform in the clinic   ° Time 8   °   Period Weeks    Status Achieved    Target Date 09/29/21      OT LONG TERM GOAL #3   Title Pt will be Mod I ADL's and light functional use of left as non dominant hand as per pt report of ability to cut food, dress, bathe etc w/o assistance or simulation in the clinic    Time 8    Period Weeks    Status Met 10/27/21   Target Date 10/26/21      OT LONG TERM GOAL #4   Title Pt will demonstrate improved A/ROM of digits left as seen by ability to oppose thumb to base of small finger and make a flat fist.    Time 8    Period Weeks    Status Achieved    Target Date 10/26/21      OT LONG TERM GOAL #5   Title Pt will demonstrate decreaesd pain in left wrist as seen by pain rating as 2-3/10 or less during functional activity and simulated homemaking tasks    Time 8    Period Weeks    Status Achieved    Target Date 09/29/21        OT Long Term Goal #6  In 5 weeks, she will increase L grip strength from 23# to at least 30# for basic functional strength in home setting.   Status: Progressing Well 28# 11/22/21 Target Date: 12/01/21   OT Long term Goal #7  In 5 weeks, she will increase Lt wrist ext from 40* to at least 55* for ability to hold objects in open palm.   Status: Progressing 49* on 11/22/21 Target Date: 12/01/21   OT long Term Goal #7 In 5 weeks, she will increase Lt shoulder flexion  from 102* painfully, to 125* non-painful for functional reach into cupboards.   Status: MET 11/22/21 Target Date: 12/01/21   OT Long Term Goal #8 In 5 weeks, she will decrease functional impairment per Quick DASH from 25% to 10% or better, for quality of life improvement.   Status: Improving- down to 13% now Target Date: 12/24/21       Plan -  UPDATED 11/22/21      Clinical Impression Statement 11/29/21: Pt doing well, progressing PRE and stretches as tolerated   11/22/21: Pt has made improvements in key motions at shoulder, wrist, grasp, but still has major limitations in sh abd, wrist flex/ext, weakness in sh/hand for which therapy is still warranted.    11/19/21: Pt doing very well, wrist not swollen but remains tight. Shoulder improving and thumb is well managed.     OT Frequency 1xweek for 4 additional weeks (through 12/24/21)     OT Duration 4 additional weeks (through 12/24/21)     OT Treatment/Interventions Self-care/ADL training; Fluidtherapy; Splinting; Therapeutic activities; Therapeutic exercise; Cryotherapy; Moist Heat; Passive range of motion; Manual Therapy; Patient/family education     Plan 11/29/21: Continue PRE and tough stretches to work on total arm health.   11/22/21: We will continue 4 weeks 1 x week, to ensure shoulder abd, wrist flex/ext, grip all improving, to provide manual therapy support, to review thumb OA issues/stable-c and potentially make orthotic if needed.     Consulted and Agree with Plan of Care Patient       Benito Mccreedy, OTR/L, CHT 11/29/2021, 10:23 AM

## 2021-12-13 ENCOUNTER — Other Ambulatory Visit: Payer: Self-pay

## 2021-12-13 ENCOUNTER — Ambulatory Visit (INDEPENDENT_AMBULATORY_CARE_PROVIDER_SITE_OTHER): Payer: Medicare Other | Admitting: Rehabilitative and Restorative Service Providers"

## 2021-12-13 DIAGNOSIS — M25612 Stiffness of left shoulder, not elsewhere classified: Secondary | ICD-10-CM | POA: Diagnosis not present

## 2021-12-13 DIAGNOSIS — M79642 Pain in left hand: Secondary | ICD-10-CM | POA: Diagnosis not present

## 2021-12-13 DIAGNOSIS — M25512 Pain in left shoulder: Secondary | ICD-10-CM

## 2021-12-13 DIAGNOSIS — R278 Other lack of coordination: Secondary | ICD-10-CM | POA: Diagnosis not present

## 2021-12-13 DIAGNOSIS — M25532 Pain in left wrist: Secondary | ICD-10-CM

## 2021-12-13 DIAGNOSIS — R601 Generalized edema: Secondary | ICD-10-CM

## 2021-12-13 DIAGNOSIS — M25642 Stiffness of left hand, not elsewhere classified: Secondary | ICD-10-CM

## 2021-12-13 DIAGNOSIS — M6281 Muscle weakness (generalized): Secondary | ICD-10-CM

## 2021-12-13 NOTE — Therapy (Addendum)
?OUTPATIENT OCCUPATIONAL THERAPY TREATMENT & DISCHARGE NOTE  ? ? ?Patient Name: Robin York ?MRN: [id number] ?DOB:06-Dec-1950, 71 y.o., female ?Today's Date: 12/13/2021 ? ?PCP: Concepcion Elk, MD ?REFERRING PROVIDER: Sherilyn Cooter, MD ?  ?12/13/21 ?Visit 15/20 ?In: 0837 ?PRF:1638 ? ?Past Medical History:  ?Diagnosis Date  ? Cancer [facility]) 2012  ? left breast cancer, stage I invasive lobular carcinoma  ? Frequent UTI   ? GERD (gastroesophageal reflux disease)   ? HLD (hyperlipidemia)   ? Hypothyroidism 04/21/2014  ? Osteoporosis   ? PAT (paroxysmal atrial tachycardia) ([place])   ? no meds, no current problem  ? PONV (postoperative nausea and vomiting)   ? ?Past Surgical History:  ?Procedure Laterality Date  ? BREAST SURGERY Bilateral 04/2011  ? bilateral mastectomies for left breast cancer with reconstruction  ? COLONOSCOPY  07/2020  ? FINGER SURGERY Left 07/2020  ? left hand -index finger  ? LAPAROSCOPIC ASSISTED VAGINAL HYSTERECTOMY    ? OPEN REDUCTION INTERNAL FIXATION (ORIF) DISTAL RADIAL FRACTURE Left 07/20/2021  ? Procedure: OPEN REDUCTION INTERNAL FIXATION (ORIF) LEFT DISTAL RADIAL FRACTURE;  Surgeon: Sherilyn Cooter, MD;  Location: [place];  Service: Orthopedics;  Laterality: Left;  ? ?Patient Active Problem List  ? Diagnosis Date Noted  ? Closed fracture of left distal radius 07/16/2021  ? Cognitive complaints with normal neuropsychological exam 03/18/2021  ? Encounter for monitoring denosumab therapy 11/02/2017  ? Bilateral impacted cerumen 10/03/2017  ? Frequent UTI 08/31/2017  ? Encounter for monitoring aromatase inhibitor therapy 12/16/2015  ? Palpitation 08/18/2015  ? Encounter for follow-up surveillance of breast cancer 06/09/2015  ? Abnormal auditory perception 06/27/2014  ? Atrial premature depolarization 06/27/2014  ? Atrophic vaginitis 06/27/2014  ? Chronic cystitis 06/27/2014  ? Hyperlipemia 06/27/2014  ? Vitamin D deficiency 06/27/2014  ? Age-related osteoporosis without current pathological fracture  04/21/2014  ? GERD (gastroesophageal reflux disease) 04/21/2014  ? Hypothyroidism 04/21/2014  ? Malignant neoplasm of upper-outer quadrant of left breast in female, estrogen receptor positive ([place]) 04/21/2014  ? Osteoporosis 04/21/2014  ? Primary localized osteoarthrosis of hand 02/23/2013  ? ? ?ONSET DATE: DOI: 06/17/21, DOS: 07/20/22 ? ?REFERRING DIAG: G66.599J (ICD-10-CM) - Other closed intra-articular fracture of distal end of left radius, initial encounter ? ?THERAPY DIAG:  ?Stiffness of left shoulder, not elsewhere classified ? ?Generalized edema ? ?Pain in left hand ? ?Other lack of coordination ? ?Muscle weakness (generalized) ? ?Pain in left wrist ? ?Acute pain of left shoulder ? ?Stiffness of left hand, not elsewhere classified ? ? ?PERTINENT HISTORY: Pt fell in closet, fx Lt distal radius, getting ORIF on 07/20/22. 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  ? ?Also history of past: Lt finger sx, hx of breast CA in remission now. ? ?PRECAUTIONS: none now more than 12 weeks out from wrist sx ? ?SUBJECTIVE:  ?12/13/21: She states she's been doing a lot of work outside and has been not having much pain, wrist is still stiff though.  ? ? ?PAIN:  ?Are you having pain? Np ?NPRS scale: 0/10 Lt shoulder ?Pain location: Lt shoulder and wrist intermittently ?Pain description: shoulder: sharp with sudden motions; wrist: dull at times ?Aggravating factors: sudden motions, weight bearing ?Relieving factors: rest, stretches, heat, ice, etc.  ? ? ?OBJECTIVE: ? ?TODAY'S TREATMENT:  ?12/13/21: She continues to work on strength, muscular endurance, mobility and fnl ability.  She does grip, AROM at wrist and shoulder for spot  check and is starting to plateau in motion progress, grip slightly better.  Her wrist flexion is not budging and she states not doing wrist flexion stretches as much. She doesn't  think a dynamic orthotic is needed as her wrist tightness doesn't effect her function significantly. She was asked to pay attention at home and return next week making sure she had no functional problems with her routines.  ? ?Pt uses UBE x5 mins on 3.5 resistance for muscular endurance. Then performs the following: ?3# wrist flex x10 ?3# wrist ext x10 ?3# pro/sup x10 ?20# rows 2x15 ?10# bicep curls b/l 2 x10 ?15# tricep push 2x15 ?3# b/l sh flys x 15  ?10# bench Press 2 x 10 ? ?At end, she is tired but not painful, feels looser.  ? ?11/29/21: Pt uses UBE x5 mins on 3.5 resistance for muscular endurance. Then performs the following: ?3# wrist flex x10 ?3# wrist ext x10 ?3# pro/sup x10 ?20# rows 2x15 ?10# bicep curls b/l 2 x10 ?15# tricep push 2x15 ?3# b/l sh flys x 15  ? ?At end, she lies supine and OT does manual stretches to Lt sh in flex, abd, ER, IR, with MFR at tight spots in axilla. She tolerates well, with some soreness at end.   ? ? ?PATIENT EDUCATION: ?Education details: see above tx section ?Person educated: Patient ?Education method: Explanation, Demonstration, Tactile cues, Verbal cues, and Handouts ?Education comprehension: verbalized understanding, returned demonstration, verbal cues required, tactile cues required, and needs further education ? ? ?HOME EXERCISE PROGRAM ?Access Code: [email or web address] ?URL: [email or web address] ?Prepared by: Benito Mccreedy ? ? ?OBJECTIVE MEASURES       ? 12/13/21: ?Lt grip strength: 30# ?Lt wrist flex: 41 * now  ?Lt writ ext: 44 * now ?Lt sh IR: 3* ?Gap from pad MF to [facility]: 1.5cm (same as right now)  ? ? ? 11/22/21:  ?Lt grip strength: 28# (compared to 57# Rt hand) ? Lt wrist flex: improved to 41* now  ? Lt writ ext: improved to 42* now ? Lt sh flex: 125* ? Lt sh abd: 88* ? Lt sh ER: 88* ? Lt sh IR: 2* ? Gap from pad MF to [facility]: 1.8cm ? Lt FA supination: 75* ? Functional Measure: Quick DASH 13% impairment today ? ?10/27/21:  ?(*ROM is A/ROM unless otherwise stated)   ? ? Lt grip strength: 23# (compared to 60# Rt hand) ? Lt wrist flex: improved to 58* now  ? Lt writ ext: improved to 40* now ? Lt sh flex: 102*  ? Lt sh abd: 120* ? Lt sh ER: 56* ? Lt sh IR: 74* ? Gap from pad MF to [facility]: 2cm ? Lt FA supination: 70* ? Functional Measure: Quick DASH 25% impairment today ? ? OT Short Term Goals - All Met as of 08/31/21 1228   ? ?  ? OT SHORT TERM GOAL #1  ? Title Pt will be Mod I splinting use, care and precautions left UE   ? Time 4   ? Period Weeks   ? Status Achieved   ? Target Date 09/01/21   ?  ? OT SHORT TERM GOAL #2  ? Title Pt will be mod I initial HEP for L wrist and hand as seen by mod I ability to perform in the clinic   ? Time 4   ? Period Weeks   ? Status Achieved   ? Target Date 09/01/21   ?  ? OT SHORT TERM GOAL #3  ? Title  Pt will be Mod I edema control techniques L UE as seen by pt ability to state 2-3 techniques w/ 1 vc or less   ? Time 4   ? Period Weeks   ? Status Achieved   ? Target Date 09/01/21   ? ?  ?  ? ?  ? ? ? OT Long Term Goals - UPDATED 11/22/21  ? ?  ? OT LONG TERM GOAL #1  ? Title Pt will be Mod I upgraded HEP L wrist/hand   ? Time 8   ? Period Weeks   ? Status Achieved   ? Target Date 10/26/21   ?  ? OT LONG TERM GOAL #2  ? Title Pt will be Mod I scar management as seen by I ability to perform in the clinic   ? Time 8   ? Period Weeks   ? Status Achieved   ? Target Date 09/29/21   ?  ? OT LONG TERM GOAL #3  ? Title Pt will be Mod I ADL's and light functional use of left as non dominant hand as per pt report of ability to cut food, dress, bathe etc w/o assistance or simulation in the clinic   ? Time 8   ? Period Weeks   ? Status Met 10/27/21  ? Target Date 10/26/21   ?  ? OT LONG TERM GOAL #4  ? Title Pt will demonstrate improved A/ROM of digits left as seen by ability to oppose thumb to base of small finger and make a flat fist.   ? Time 8   ? Period Weeks   ? Status Achieved   ? Target Date 10/26/21   ?  ? OT LONG TERM GOAL #5  ? Title Pt will demonstrate  decreaesd pain in left wrist as seen by pain rating as 2-3/10 or less during functional activity and simulated homemaking tasks   ? Time 8   ? Period Weeks   ? Status Achieved   ? Target Date 09/29/21   ?

## 2021-12-15 ENCOUNTER — Ambulatory Visit: Payer: Medicare Other | Admitting: Urology

## 2021-12-15 ENCOUNTER — Other Ambulatory Visit: Payer: Self-pay

## 2021-12-15 ENCOUNTER — Encounter: Payer: Self-pay | Admitting: Urology

## 2021-12-15 VITALS — BP 157/71 | HR 72 | Wt 125.0 lb

## 2021-12-15 DIAGNOSIS — Z87440 Personal history of urinary (tract) infections: Secondary | ICD-10-CM

## 2021-12-15 LAB — URINALYSIS, ROUTINE W REFLEX MICROSCOPIC
Bilirubin, UA: NEGATIVE
Glucose, UA: NEGATIVE
Ketones, UA: NEGATIVE
Leukocytes,UA: NEGATIVE
Nitrite, UA: NEGATIVE
Protein,UA: NEGATIVE
Specific Gravity, UA: 1.015 (ref 1.005–1.030)
Urobilinogen, Ur: 0.2 mg/dL (ref 0.2–1.0)
pH, UA: 7 (ref 5.0–7.5)

## 2021-12-15 MED ORDER — CIPROFLOXACIN HCL 500 MG PO TABS: 500.0000 mg | ORAL_TABLET | Freq: Two times a day (BID) | ORAL | 0 refills | Status: AC

## 2021-12-15 NOTE — Progress Notes (Signed)
? ?Assessment: ?1. History of recurrent UTIs   ? ? ?Plan: ?She would like a trial off of the daily antibiotic. ?Rx for Cipro 500 mg BID x 5 days provided for prn use ?Continue methods to reduce the risk of UTIs including increased fluid intake, timed and double voiding, daily cranberry supplement, daily probiotics. ?Return to office in 3-4 months.  ? ?Chief Complaint: ?Chief Complaint  ?Patient presents with  ? Recurrent UTI  ? ? ?HPI: ?Robin York is a 71 y.o. female who presents for continued evaluation of recurrent UTIs.  She was previously followed in [facility]. ?Urologic History: ?She has a history of frequent UTI's with 3-4 UTI's per year. Typical symptoms include bladder pressure, frequency. No dysuria, flank pain, or gross hematuria. Marland Kitchen  ?Urine culture from 12/18 showed no growth. She was started on daily TMP. No UTI symptoms while on daily TMP.  ?She discontinued the medication in May 2019.  ?She had UTI symptoms in August 2019, resolved with doxycycline.  ?Urine culture from 3/20 grew 50-100K Burkholderia. Treated with Septra DS. ?Urine culture from 6/20 showed no growth. ?She was treated with Septra x5 days for UTI symptoms in July 2020. Her symptoms resolved following antibiotic therapy. ?She was seen in 8/20 with a several week history of worsening bladder pressure and frequency. No dysuria or gross hematuria. No low back pain.  ?Urine culture 8/20: >100K Enterococcus. Treated with Augmentin and started on daily amoxicillin. ?Urine culture from 9/20: No growth ?Urine culture from 4/21: >100K Enterococcus. Treated with Augmentin. ?She was again treated with Augmentin in May 2021. ?She reported resolution of her UTI symptoms for approximately 7-10 days after completing antibiotics. She discontinued the daily antibiotic last fall. ?She was seen in June 2021 for a recurrence of her UTI symptoms with frequency, urgency, nocturia, and bladder pressure. No fever, chills, flank pain, or gross  hematuria. ?Urine culture grew >100K Pseudomonas. She was treated with cefdinir. She was then started on daily Keflex for UTI prophylaxis. She had recurrence of her symptoms in late July 2021. Urine culture grew >100 K Pseudomonas. She was treated with Cipro x 5 days. She resumed the daily Keflex. ? ?At the time of her last visit in June 2022, she had discontinued the daily Keflex approximately 10 days prior.  She was not having any dysuria or gross hematuria. She had noted a slight increase in her nocturia and urgency. ?Urine culture from 6/22 grew 50-100 K Pseudomonas.  She was treated with Cipro and restarted on daily cephalexin. ? ?She continued on the daily cephalexin until November 2022.  She reported treatment for a presumed UTI in October 2022 at urgent care.  Urine culture grew <10K mixed flora.  She was not having any UTI symptoms at her visit in 12/22..  No dysuria or gross hematuria.  She continued with nocturia. ?Urine culture from 12/22 grew <10K colonies mixed flora. ? ?She returns today for follow-up.  She has resume the daily cephalexin.  She has not had any UTI symptoms since her last visit in December 2022.  No dysuria or gross hematuria. ? ?Portions of the above documentation were copied from a prior visit for review purposes only. ? ?Allergies: ?Allergies  ?Allergen Reactions  ? Nitrofurantoin Nausea And Vomiting  ? ? ?PMH: ?Past Medical History:  ?Diagnosis Date  ? Cancer [facility]) 2012  ? left breast cancer, stage I invasive lobular carcinoma  ? Frequent UTI   ? GERD (gastroesophageal reflux disease)   ? HLD (hyperlipidemia)   ?  Hypothyroidism 04/21/2014  ? Osteoporosis   ? PAT (paroxysmal atrial tachycardia) ([place])   ? no meds, no current problem  ? PONV (postoperative nausea and vomiting)   ? ? ?PSH: ?Past Surgical History:  ?Procedure Laterality Date  ? BREAST SURGERY Bilateral 04/2011  ? bilateral mastectomies for left breast cancer with reconstruction  ? COLONOSCOPY  07/2020  ? FINGER SURGERY  Left 07/2020  ? left hand -index finger  ? LAPAROSCOPIC ASSISTED VAGINAL HYSTERECTOMY    ? OPEN REDUCTION INTERNAL FIXATION (ORIF) DISTAL RADIAL FRACTURE Left 07/20/2021  ? Procedure: OPEN REDUCTION INTERNAL FIXATION (ORIF) LEFT DISTAL RADIAL FRACTURE;  Surgeon: Sherilyn Cooter, MD;  Location: [employer];  Service: Orthopedics;  Laterality: Left;  ? ? ?SH: ?Social History  ? ?Tobacco Use  ? Smoking status: Never  ? Smokeless tobacco: Never  ?Vaping Use  ? Vaping Use: Never used  ?Substance Use Topics  ? Alcohol use: Yes  ?  Alcohol/week: 7.0 standard drinks  ?  Types: 7 Glasses of wine per week  ? Drug use: No  ? ? ?ROS: ?Constitutional:  Negative for fever, chills, weight loss ?CV: Negative for chest pain, previous MI, hypertension ?Respiratory:  Negative for shortness of breath, wheezing, sleep apnea, frequent cough ?GI:  Negative for nausea, vomiting, bloody stool, GERD ? ?PE: ?BP (!) 157/71   Pulse 72   Wt 125 lb (56.7 kg)   BMI 20.18 kg/m?  ?GENERAL APPEARANCE:  Well appearing, well developed, well nourished, NAD ?HEENT:  Atraumatic, normocephalic, oropharynx clear ?NECK:  Supple without lymphadenopathy or thyromegaly ?ABDOMEN:  Soft, non-tender, no masses ?EXTREMITIES:  Moves all extremities well, without clubbing, cyanosis, or edema ?NEUROLOGIC:  Alert and oriented x 3, normal gait, CN II-XII grossly intact ?MENTAL STATUS:  appropriate ?BACK:  Non-tender to palpation, No CVAT ?SKIN:  Warm, dry, and intact ? ? ?Results: ?U/A dipstick negative ?

## 2021-12-23 ENCOUNTER — Encounter: Payer: Medicare Other | Admitting: Rehabilitative and Restorative Service Providers"

## 2021-12-27 ENCOUNTER — Ambulatory Visit: Payer: Medicare Other | Admitting: Orthopedic Surgery

## 2021-12-30 ENCOUNTER — Encounter: Payer: Self-pay | Admitting: Orthopedic Surgery

## 2021-12-30 ENCOUNTER — Ambulatory Visit: Payer: Medicare Other | Admitting: Orthopedic Surgery

## 2021-12-30 ENCOUNTER — Ambulatory Visit (INDEPENDENT_AMBULATORY_CARE_PROVIDER_SITE_OTHER): Payer: Medicare Other

## 2021-12-30 DIAGNOSIS — S52572A Other intraarticular fracture of lower end of left radius, initial encounter for closed fracture: Secondary | ICD-10-CM

## 2021-12-30 NOTE — Progress Notes (Signed)
? ?Office Visit Note ?  ?Patient: Robin York           ?Date of Birth: 03/14/1951           ?MRN: [id number] ?Visit Date: 12/30/2021 ?             ?Requested by: Concepcion Elk, MD ?[address] ?[place],  [place] [address] ?PCP: Concepcion Elk, MD ? ? ?Assessment & Plan: ?Visit Diagnoses:  ?1. Other closed intra-articular fracture of distal end of left radius, initial encounter   ? ? ?Plan: Patient is 5-1/2 months out from ORIF of her left distal radius fracture.  She is doing great.  She has graduated from therapy.  She has much improved range of motion.  She is able to make a full and tight fist.  X-rays today show complete healing of the fracture with unchanged plate position.  She has no restrictions.  She can see me again as needed. ? ?Follow-Up Instructions: No follow-ups on file.  ? ?Orders:  ?Orders Placed This Encounter  ?Procedures  ? XR Wrist Complete Left  ? ?No orders of the defined types were placed in this encounter. ? ? ? ? Procedures: ?No procedures performed ? ? ?Clinical Data: ?No additional findings. ? ? ?Subjective: ?Chief Complaint  ?Patient presents with  ? Left Wrist - Routine Post Op  ? ? ?This is a 71 year old right-hand-dominant female who presents for follow-up of a left distal radius fracture status post ORIF in November 1 of last year.  She is doing great.  She has full range of motion of her fingers and is able to make a complete fist.  She has gotten back into golf and started working on putting and swinging some low irons.  She has no pain in her wrist.  She has no swelling in the wrist.  She is overall very pleased with her result. ? ? ?Review of Systems ? ? ?Objective: ?Vital Signs: There were no vitals taken for this visit. ? ?Physical Exam ? ?Left Hand Exam  ? ?Tenderness  ?The patient is experiencing no tenderness.  ? ?Other  ?Erythema: absent ?Sensation: normal ?Pulse: present ? ?Comments:  Able to make a full, complete fist.  Wrist ROM is 50/35 (60/40 on c/l side).  No swelling.    ? ? ? ? ?Specialty Comments:  ?No specialty comments available. ? ?Imaging: ?No results found. ? ? ?PMFS History: ?Patient Active Problem List  ? Diagnosis Date Noted  ? Closed fracture of left distal radius 07/16/2021  ? Cognitive complaints with normal neuropsychological exam 03/18/2021  ? Encounter for monitoring denosumab therapy 11/02/2017  ? Bilateral impacted cerumen 10/03/2017  ? Frequent UTI 08/31/2017  ? Encounter for monitoring aromatase inhibitor therapy 12/16/2015  ? Palpitation 08/18/2015  ? Encounter for follow-up surveillance of breast cancer 06/09/2015  ? Abnormal auditory perception 06/27/2014  ? Atrial premature depolarization 06/27/2014  ? Atrophic vaginitis 06/27/2014  ? Chronic cystitis 06/27/2014  ? Hyperlipemia 06/27/2014  ? Vitamin D deficiency 06/27/2014  ? Age-related osteoporosis without current pathological fracture 04/21/2014  ? GERD (gastroesophageal reflux disease) 04/21/2014  ? Hypothyroidism 04/21/2014  ? Malignant neoplasm of upper-outer quadrant of left breast in female, estrogen receptor positive ([place]) 04/21/2014  ? Osteoporosis 04/21/2014  ? Primary localized osteoarthrosis of hand 02/23/2013  ? ?Past Medical History:  ?Diagnosis Date  ? Cancer [facility]) 2012  ? left breast cancer, stage I invasive lobular carcinoma  ? Frequent UTI   ? GERD (gastroesophageal reflux disease)   ?  HLD (hyperlipidemia)   ? Hypothyroidism 04/21/2014  ? Osteoporosis   ? PAT (paroxysmal atrial tachycardia) ([place])   ? no meds, no current problem  ? PONV (postoperative nausea and vomiting)   ?  ?History reviewed. No pertinent family history.  ?Past Surgical History:  ?Procedure Laterality Date  ? BREAST SURGERY Bilateral 04/2011  ? bilateral mastectomies for left breast cancer with reconstruction  ? COLONOSCOPY  07/2020  ? FINGER SURGERY Left 07/2020  ? left hand -index finger  ? LAPAROSCOPIC ASSISTED VAGINAL HYSTERECTOMY    ? OPEN REDUCTION INTERNAL FIXATION (ORIF) DISTAL RADIAL FRACTURE Left 07/20/2021  ?  Procedure: OPEN REDUCTION INTERNAL FIXATION (ORIF) LEFT DISTAL RADIAL FRACTURE;  Surgeon: Sherilyn Cooter, MD;  Location: [place];  Service: Orthopedics;  Laterality: Left;  ? ?Social History  ? ?Occupational History  ? Not on file  ?Tobacco Use  ? Smoking status: Never  ? Smokeless tobacco: Never  ?Vaping Use  ? Vaping Use: Never used  ?Substance and Sexual Activity  ? Alcohol use: Yes  ?  Alcohol/week: 7.0 standard drinks  ?  Types: 7 Glasses of wine per week  ? Drug use: No  ? Sexual activity: Yes  ?  Birth control/protection: None  ?  Comment: Hysterectomy  ? ? ? ? ? ? ?

## 2022-01-03 ENCOUNTER — Encounter: Payer: Medicare Other | Admitting: Rehabilitative and Restorative Service Providers"

## 2022-03-15 ENCOUNTER — Encounter: Payer: Self-pay | Admitting: Urology

## 2022-03-15 ENCOUNTER — Ambulatory Visit: Payer: Medicare Other | Admitting: Urology

## 2022-03-15 DIAGNOSIS — Z87440 Personal history of urinary (tract) infections: Secondary | ICD-10-CM | POA: Diagnosis not present

## 2022-03-15 DIAGNOSIS — R8290 Unspecified abnormal findings in urine: Secondary | ICD-10-CM | POA: Diagnosis not present

## 2022-03-15 LAB — URINALYSIS, ROUTINE W REFLEX MICROSCOPIC
Bilirubin, UA: NEGATIVE
Glucose, UA: NEGATIVE
Ketones, UA: NEGATIVE
Nitrite, UA: NEGATIVE
Protein,UA: NEGATIVE
Specific Gravity, UA: <1.005 — ABNORMAL LOW (ref 1.005–1.030)
Urobilinogen, Ur: 0.2 mg/dL (ref 0.2–1.0)
pH, UA: 5 (ref 5.0–7.5)

## 2022-03-15 LAB — MICROSCOPIC EXAMINATION: Renal Epithel, UA: NONE SEEN /hpf

## 2022-03-15 NOTE — Progress Notes (Addendum)
Assessment: 1. History of recurrent UTIs   2. Abnormal urine findings     Plan: Urine culture sent today -we will contact her with results  I connected with  Kelli Churn on 03/15/22 by phone and verified that I am speaking with the correct person using two identifiers.   I discussed the limitations of evaluation and management by telemedicine. The patient expressed understanding and agreed to proceed.  Provider location:  office Patient location:  home (left office earlier prior to being seen) I provided 15 minutes of non-face-to-face time during this encounter  Chief Complaint: Chief Complaint  Patient presents with   history of UTI    HPI: Robin York is a 71 y.o. female who presents for continued evaluation of recurrent UTIs.  She was previously followed in [facility]. Urologic History: She has a history of frequent UTI's with 3-4 UTI's per year. Typical symptoms include bladder pressure, frequency. No dysuria, flank pain, or gross hematuria. .  Urine culture from 12/18 showed no growth. She was started on daily TMP. No UTI symptoms while on daily TMP.  She discontinued the medication in May 2019.  She had UTI symptoms in August 2019, resolved with doxycycline.  Urine culture from 3/20 grew 50-100K Burkholderia. Treated with Septra DS. Urine culture from 6/20 showed no growth. She was treated with Septra x5 days for UTI symptoms in July 2020. Her symptoms resolved following antibiotic therapy. She was seen in 8/20 with a several week history of worsening bladder pressure and frequency. No dysuria or gross hematuria. No low back pain.  Urine culture 8/20: >100K Enterococcus. Treated with Augmentin and started on daily amoxicillin. Urine culture from 9/20: No growth Urine culture from 4/21: >100K Enterococcus. Treated with Augmentin. She was again treated with Augmentin in May 2021. She reported resolution of her UTI symptoms for approximately 7-10 days after  completing antibiotics. She discontinued the daily antibiotic last fall. She was seen in June 2021 for a recurrence of her UTI symptoms with frequency, urgency, nocturia, and bladder pressure. No fever, chills, flank pain, or gross hematuria. Urine culture grew >100K Pseudomonas. She was treated with cefdinir. She was then started on daily Keflex for UTI prophylaxis. She had recurrence of her symptoms in late July 2021. Urine culture grew >100 K Pseudomonas. She was treated with Cipro x 5 days. She resumed the daily Keflex.  At the time of her last visit in June 2022, she had discontinued the daily Keflex approximately 10 days prior.  She was not having any dysuria or gross hematuria. She had noted a slight increase in her nocturia and urgency. Urine culture from 6/22 grew 50-100 K Pseudomonas.  She was treated with Cipro and restarted on daily cephalexin.  She continued on the daily cephalexin until November 2022.  She reported treatment for a presumed UTI in October 2022 at urgent care.  Urine culture grew <10K mixed flora.  She was not having any UTI symptoms at her visit in 12/22.  No dysuria or gross hematuria.  She continued with nocturia. Urine culture from 12/22 grew <10K colonies mixed flora.  At her visit in 3/23, she had resumed the daily cephalexin.  She had not had any UTI symptoms since her visit in December 2022.  No dysuria or gross hematuria.  I spoke with the patient by phone today as she left prior to being seen due to another appointment.  She has noted a slight increase in frequency and nocturia.  No dysuria, gross hematuria,  or flank pain.  She has not required antibiotic therapy for UTI since her last visit.  Portions of the above documentation were copied from a prior visit for review purposes only.  Allergies: Allergies  Allergen Reactions   Nitrofurantoin Nausea And Vomiting    PMH: Past Medical History:  Diagnosis Date   Cancer ([place]) 2012   left breast cancer,  stage I invasive lobular carcinoma   Frequent UTI    GERD (gastroesophageal reflux disease)    HLD (hyperlipidemia)    Hypothyroidism 04/21/2014   Osteoporosis    PAT (paroxysmal atrial tachycardia) ([facility])    no meds, no current problem   PONV (postoperative nausea and vomiting)     PSH: Past Surgical History:  Procedure Laterality Date   BREAST SURGERY Bilateral 04/2011   bilateral mastectomies for left breast cancer with reconstruction   COLONOSCOPY  07/2020   FINGER SURGERY Left 07/2020   left hand -index finger   LAPAROSCOPIC ASSISTED VAGINAL HYSTERECTOMY     OPEN REDUCTION INTERNAL FIXATION (ORIF) DISTAL RADIAL FRACTURE Left 07/20/2021   Procedure: OPEN REDUCTION INTERNAL FIXATION (ORIF) LEFT DISTAL RADIAL FRACTURE;  Surgeon: Sherilyn Cooter, MD;  Location: [place];  Service: Orthopedics;  Laterality: Left;    SH: Social History   Tobacco Use   Smoking status: Never   Smokeless tobacco: Never  Vaping Use   Vaping Use: Never used  Substance Use Topics   Alcohol use: Yes    Alcohol/week: 7.0 standard drinks of alcohol    Types: 7 Glasses of wine per week   Drug use: No    ROS: Constitutional:  Negative for fever, chills, weight loss CV: Negative for chest pain, previous MI, hypertension Respiratory:  Negative for shortness of breath, wheezing, sleep apnea, frequent cough GI:  Negative for nausea, vomiting, bloody stool, GERD  PE: Not done  Results: U/A: 6-10 WBCs, 0-2 RBCs, few bacteria, nitrite negative

## 2022-03-15 NOTE — Progress Notes (Signed)
Called patient from waiting room to room- patient not there. Per front desk patient left.

## 2022-03-18 ENCOUNTER — Encounter: Payer: Self-pay | Admitting: Urology

## 2022-03-18 LAB — URINE CULTURE

## 2022-03-18 MED ORDER — CIPROFLOXACIN HCL 500 MG PO TABS: 500.0000 mg | ORAL_TABLET | Freq: Two times a day (BID) | ORAL | 0 refills | Status: AC

## 2022-03-18 NOTE — Addendum Note (Signed)
Addended by: Primus Bravo on: 03/18/2022 12:04 PM   Modules accepted: Orders

## 2022-03-21 NOTE — Telephone Encounter (Signed)
Patient made aware via my chart message on 6/30

## 2022-03-21 NOTE — Telephone Encounter (Signed)
-----   Message from Primus Bravo, MD sent at 03/18/2022 12:04 PM EDT ----- Please notify patient that her urine culture did show evidence of UTI.  Recommend treatment with Cipro twice a day for 5 days.  Prescription sent.

## 2022-04-10 IMAGING — DX DG WRIST 2V*L*
2 series · 2 of 2 positions shown · non-contrast
Comparison: July 15, 2021

CLINICAL DATA: Post reduction images.

EXAM:
LEFT WRIST - 2 VIEW

[wrist ap]
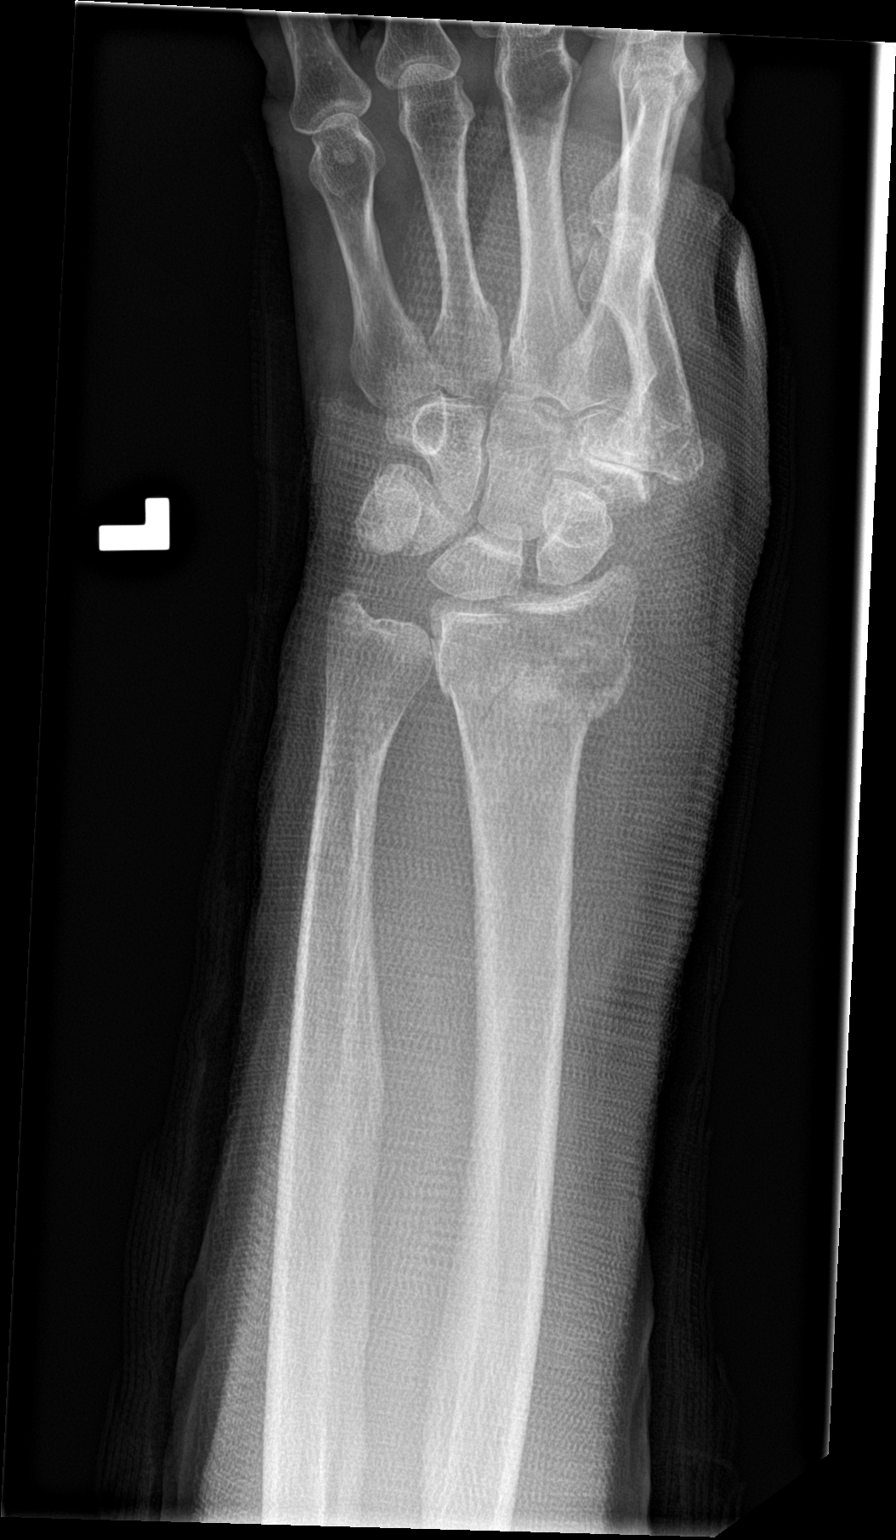

[wrist lat]
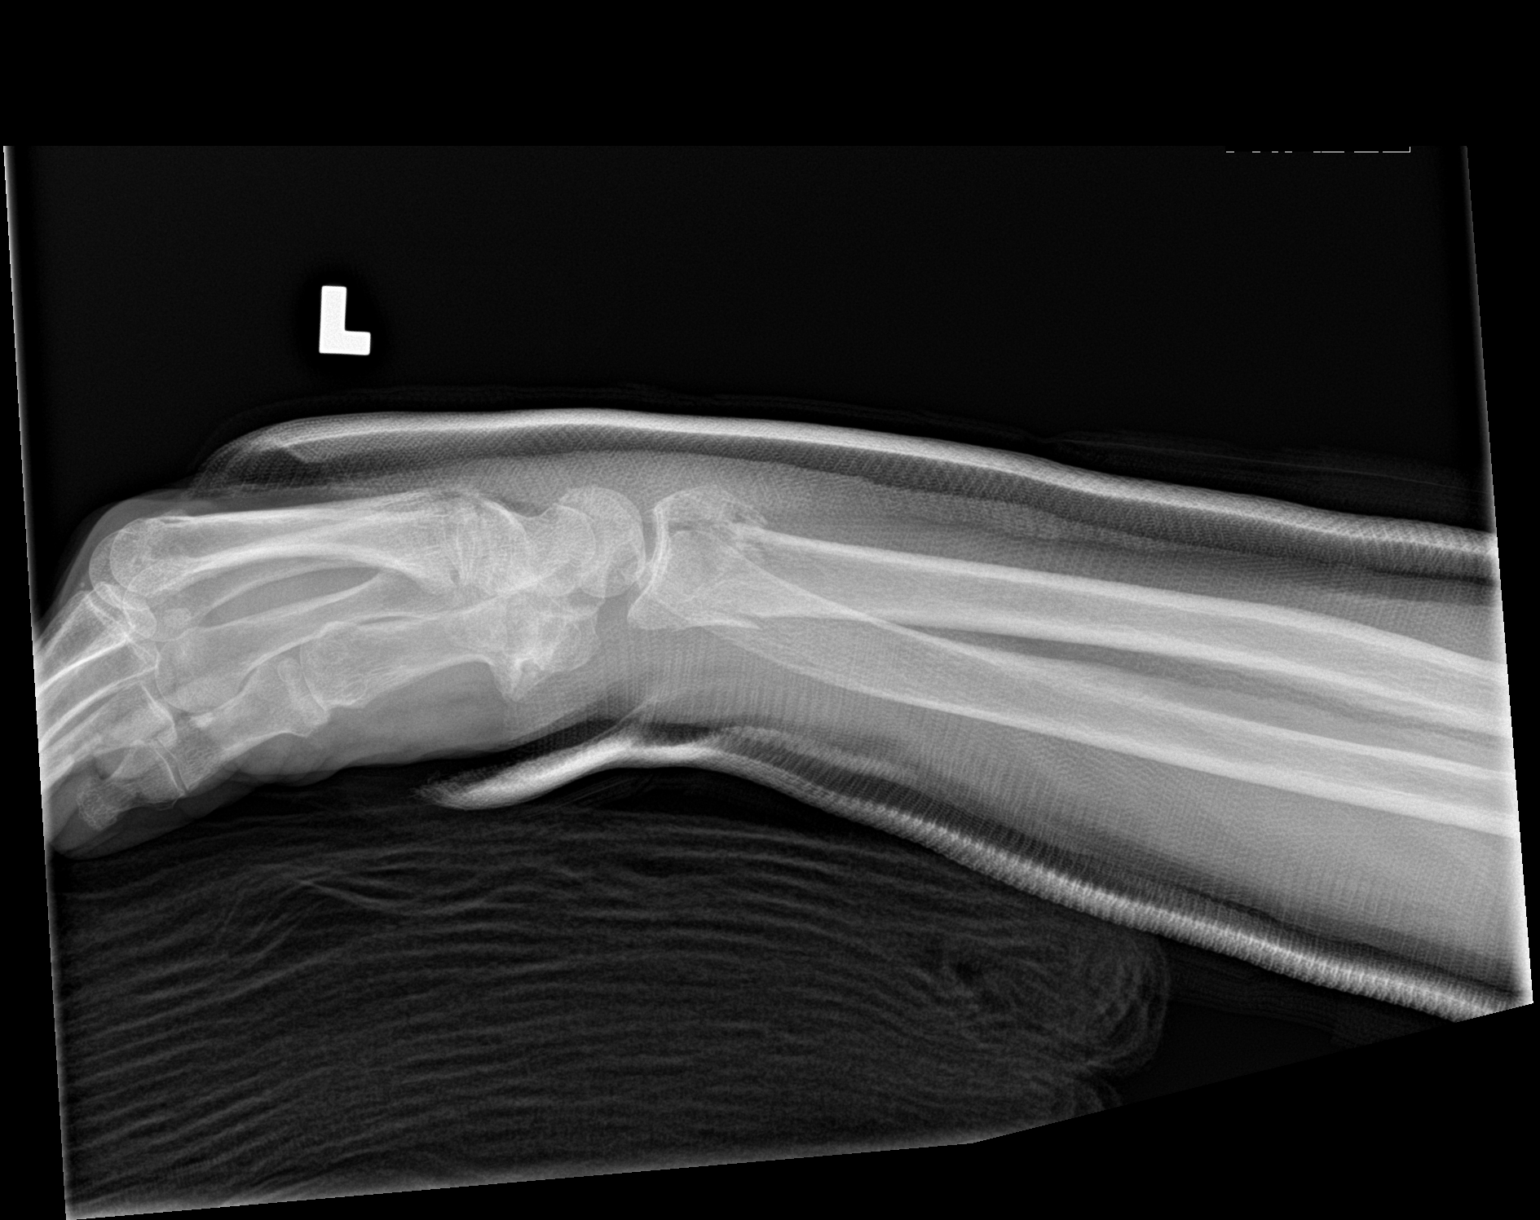

[2 of 2 positions shown; findings below may reference images not displayed]

FINDINGS: The left wrist was imaged in a fiberglass cast with subsequently
obscured osseous and soft tissue detail. Acute fracture of the
distal left radius is seen with gross anatomic alignment. There is
no evidence of dislocation. Degenerative changes seen involving the
carpometacarpal articulation of the left thumb. Soft tissue swelling
is noted adjacent to the previously described fracture site.
IMPRESSION: Status post reduction of fracture of the distal left radius with
gross anatomic alignment.

## 2022-04-10 IMAGING — DX DG WRIST COMPLETE 3+V*L*
5 series · 5 of 5 positions shown · non-contrast
Comparison: None.

CLINICAL DATA: Fall, wrist injury

EXAM:
LEFT WRIST - COMPLETE 3+ VIEW

[wrist pa]
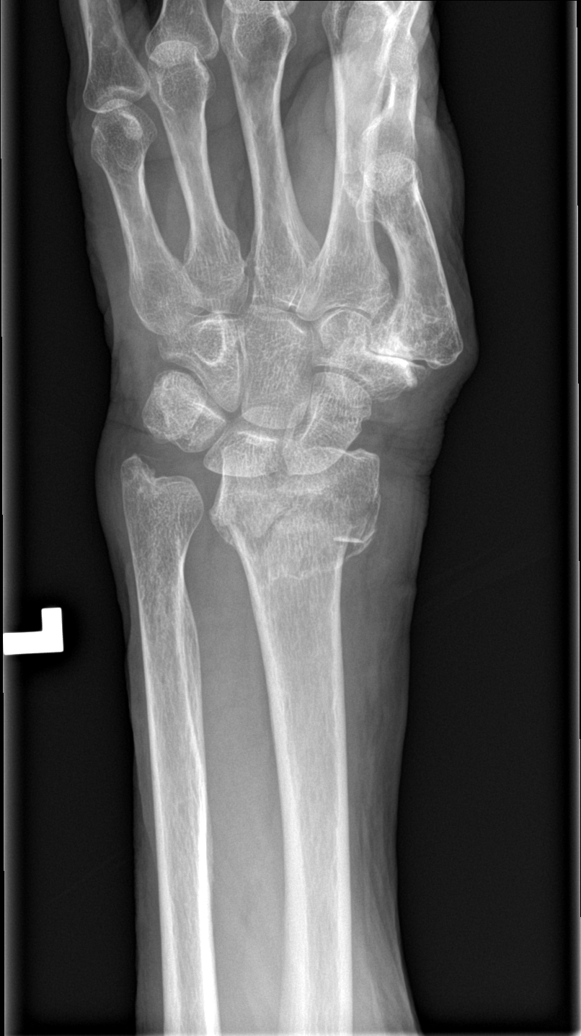

[wrist obl (1 of 2)]
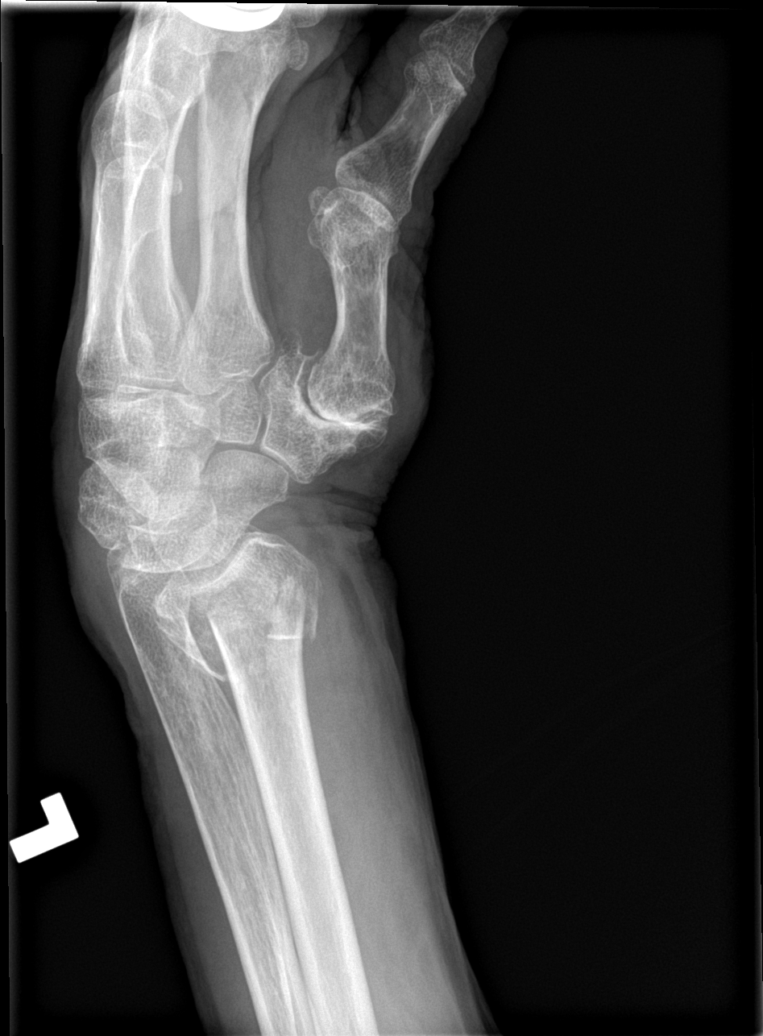

[wrist lat]
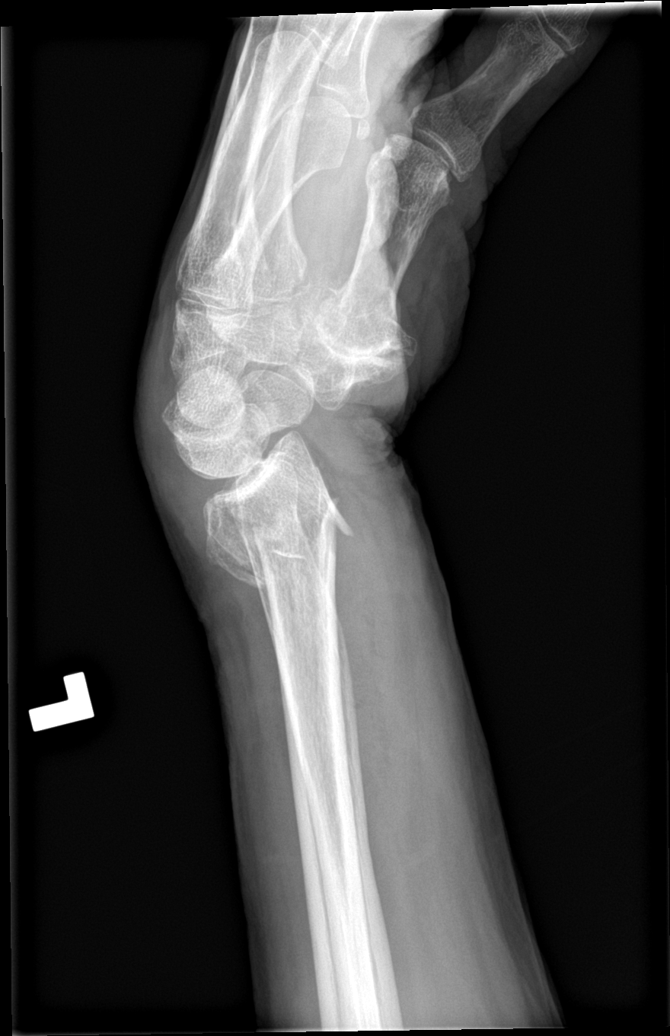

[wrist navicular]
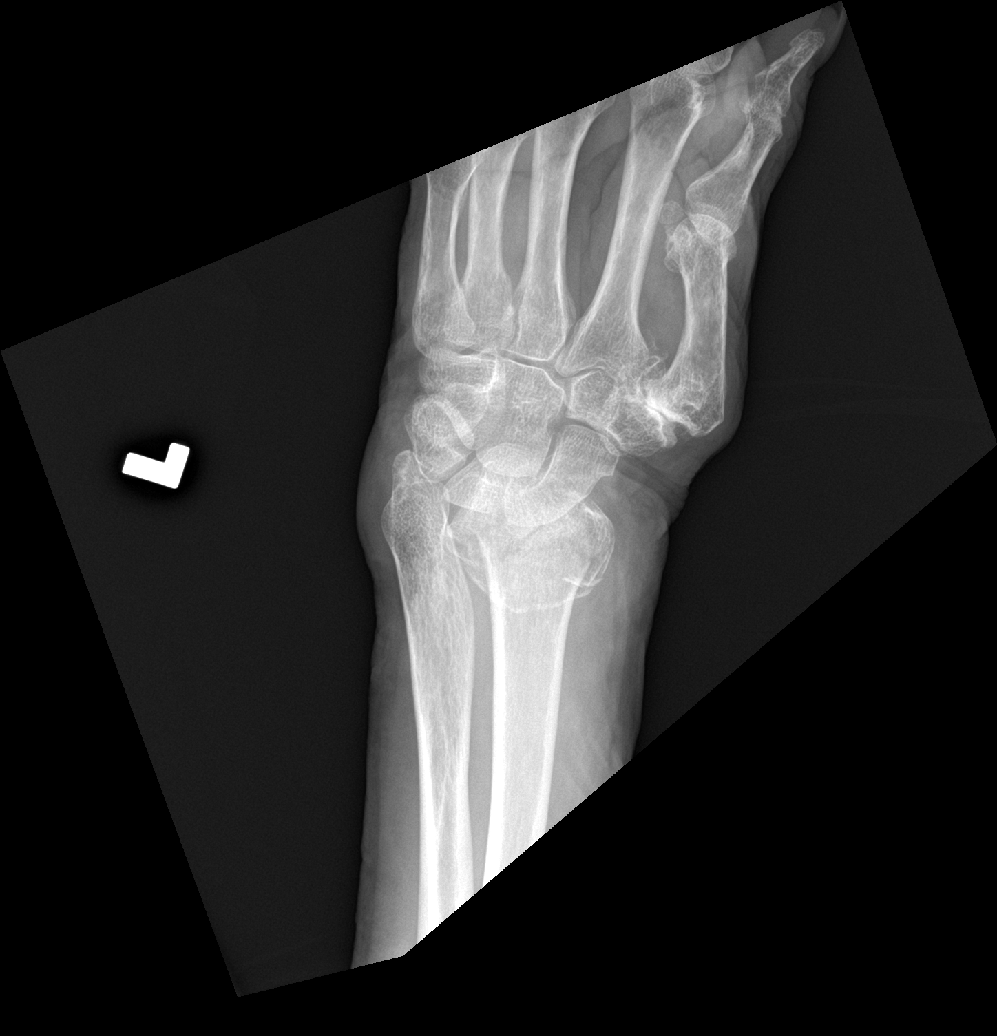

[wrist obl (2 of 2)]
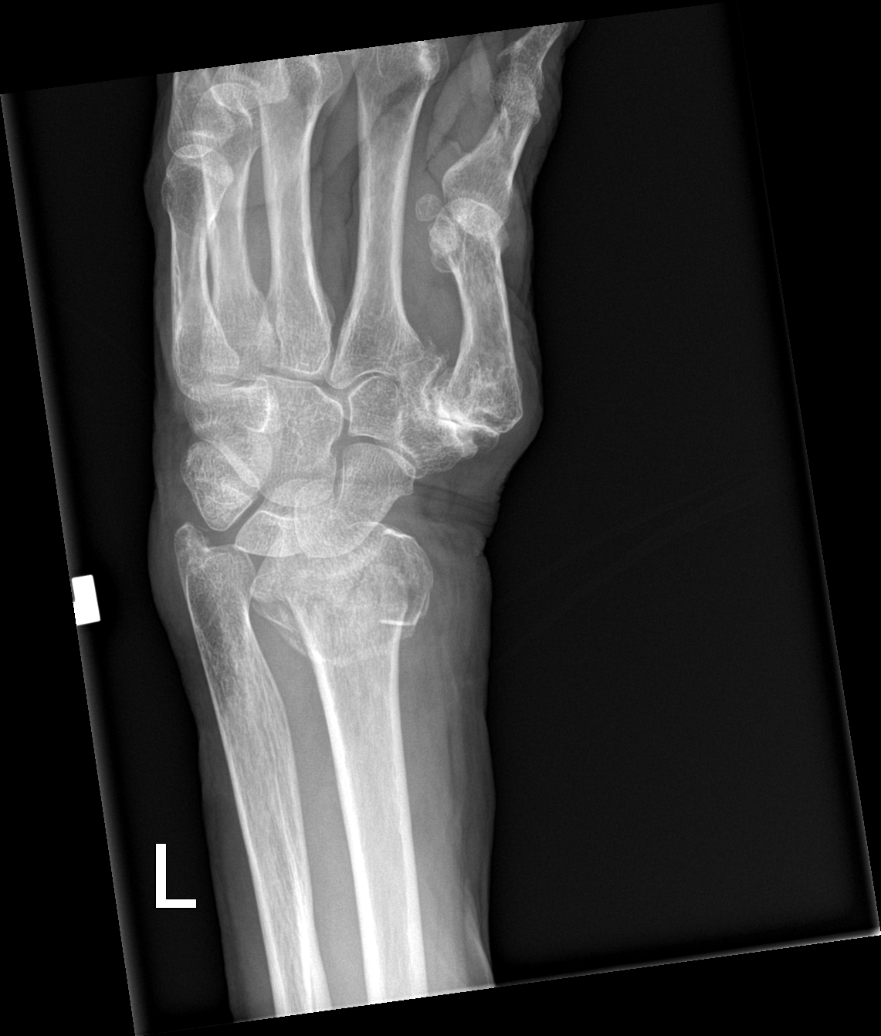

[5 of 5 positions shown; findings below may reference images not displayed]

FINDINGS: Comminuted impacted fracture in the distal left radius with
intra-articular extension and mild posterior
angulation/displacement. No subluxation or dislocation. Probable
small avulsion off the ulnar styloid.
IMPRESSION: Impacted, comminuted intra-articular distal left radial fracture
with posterior angulation and displacement.

Ulnar styloid avulsion.

## 2023-03-29 ENCOUNTER — Other Ambulatory Visit: Payer: Self-pay

## 2023-03-29 ENCOUNTER — Encounter (HOSPITAL_BASED_OUTPATIENT_CLINIC_OR_DEPARTMENT_OTHER): Payer: Self-pay

## 2023-03-29 ENCOUNTER — Other Ambulatory Visit: Payer: Self-pay | Admitting: Student

## 2023-03-29 ENCOUNTER — Emergency Department (HOSPITAL_BASED_OUTPATIENT_CLINIC_OR_DEPARTMENT_OTHER): Payer: Medicare Other

## 2023-03-29 ENCOUNTER — Emergency Department (HOSPITAL_BASED_OUTPATIENT_CLINIC_OR_DEPARTMENT_OTHER)
Admission: EM | Admit: 2023-03-29 | Discharge: 2023-03-29 | Disposition: A | Discharge status: Home / Self Care | Payer: Medicare Other | Attending: Emergency Medicine | Admitting: Emergency Medicine

## 2023-03-29 DIAGNOSIS — I491 Atrial premature depolarization: Secondary | ICD-10-CM

## 2023-03-29 DIAGNOSIS — R0602 Shortness of breath: Secondary | ICD-10-CM | POA: Diagnosis present

## 2023-03-29 DIAGNOSIS — E039 Hypothyroidism, unspecified: Secondary | ICD-10-CM | POA: Diagnosis not present

## 2023-03-29 LAB — BASIC METABOLIC PANEL
Anion gap: 9 (ref 5–15)
BUN: 12 mg/dL (ref 8–23)
CO2: 27 mmol/L (ref 22–32)
Calcium: 9.5 mg/dL (ref 8.9–10.3)
Chloride: 101 mmol/L (ref 98–111)
Creatinine, Ser: 0.73 mg/dL (ref 0.44–1.00)
GFR, Estimated: >60 mL/min (ref >60)
Glucose, Bld: 95 mg/dL (ref 70–99)
Potassium: 3.4 mmol/L — ABNORMAL LOW (ref 3.5–5.1)
Sodium: 137 mmol/L (ref 135–145)

## 2023-03-29 LAB — CBC
HCT: 41 % (ref 36.0–46.0)
Hemoglobin: 13.6 g/dL (ref 12.0–15.0)
MCH: 31.1 pg (ref 26.0–34.0)
MCHC: 33.2 g/dL (ref 30.0–36.0)
MCV: 93.6 fL (ref 80.0–100.0)
Platelets: 279 10*3/uL (ref 150–400)
RBC: 4.38 MIL/uL (ref 3.87–5.11)
RDW: 13.3 % (ref 11.5–15.5)
WBC: 7.9 10*3/uL (ref 4.0–10.5)
nRBC: 0 % (ref 0.0–0.2)

## 2023-03-29 LAB — MAGNESIUM: Magnesium: 1.8 mg/dL (ref 1.7–2.4)

## 2023-03-29 LAB — TROPONIN I (HIGH SENSITIVITY)
Troponin I (High Sensitivity): 5 ng/L (ref <18)
Troponin I (High Sensitivity): 5 ng/L (ref <18)

## 2023-03-29 LAB — TSH: TSH: 5.707 u[IU]/mL — ABNORMAL HIGH (ref 0.350–4.500)

## 2023-03-29 MED ORDER — POTASSIUM CHLORIDE CRYS ER 20 MEQ PO TBCR
40.0000 meq | EXTENDED_RELEASE_TABLET | Freq: Once | ORAL | Status: AC
  Administered 2023-03-29: 40 meq via ORAL
  Filled 2023-03-29: qty 2

## 2023-03-29 MED ORDER — METOPROLOL SUCCINATE ER 25 MG PO TB24: 12.5000 mg | ORAL_TABLET | Freq: Every day | ORAL | 0 refills | Status: DC

## 2023-03-29 MED ORDER — METOPROLOL SUCCINATE ER 25 MG PO TB24: 12.5000 mg | ORAL_TABLET | Freq: Every day | ORAL | 0 refills | Status: AC

## 2023-03-29 NOTE — ED Triage Notes (Addendum)
Pt arrived POV . Pt reports palpitations for apprx 1 week. Intermittently .Since last night appox 7pm last night has been continuous. SOB. Denies CP. Pts fit bit notified of elevated resp at 40

## 2023-03-29 NOTE — Discharge Instructions (Addendum)
Your TSH will likely result today. Please check it on my chart and give this result to your PCP. Call them today.   Pick up medicine from pharmacy. Take metoprolol once daily.  FU with cardiology for your heart. Referral placed.

## 2023-03-29 NOTE — ED Provider Notes (Signed)
[facility] Provider Note   CSN: [id number] Arrival date & time: 03/29/23  2130     History Chief Complaint  Patient presents with   Palpitations     Palpitations Associated symptoms: chest pain and shortness of breath   Associated symptoms: no diaphoresis, no nausea and no vomiting    Robin York is a 72 y.o. female presenting for chest palpitations that have been present for the last 1-2 weeks but today has SOB and lightheadedness. Had a similar episode like this 15 years ago and all her testing was normal but then saw a cardiologist and it was found that she is hypothyroid. She is not on any hypothyroid meds. Mostly decaf drinks. Chest pain is dull and mild.   Patient's recorded medical, surgical, social, medication list and allergies were reviewed in the Snapshot window as part of the initial history.   Review of Systems   Review of Systems  Constitutional:  Positive for fatigue. Negative for chills, diaphoresis and fever.  Respiratory:  Positive for shortness of breath.   Cardiovascular:  Positive for chest pain and palpitations.  Gastrointestinal:  Negative for abdominal pain, constipation, nausea and vomiting.  Genitourinary:  Negative for difficulty urinating and frequency.  Neurological:  Positive for light-headedness.    Physical Exam Updated Vital Signs BP 134/75   Pulse 69   Temp 98.4 F (36.9 C) (Oral)   Resp 15   Ht 5\' 6"  (1.676 m)   Wt 55.3 kg   SpO2 99%   BMI 19.69 kg/m  Physical Exam Constitutional:      General: She is not in acute distress.    Appearance: She is not ill-appearing.  HENT:     Mouth/Throat:     Mouth: Mucous membranes are dry.  Cardiovascular:     Rate and Rhythm: Normal rate. Rhythm irregular.     Chest Wall: No thrill.     Heart sounds: S1 normal and S2 normal. Heart sounds not distant. No murmur heard.    No friction rub. No gallop.     Comments: No carotid  bruits. Pulmonary:     Effort: No respiratory distress.     Breath sounds: No wheezing, rhonchi or rales.  Abdominal:     General: There is no distension.     Tenderness: There is no abdominal tenderness. There is no guarding.  Musculoskeletal:     Right lower leg: No edema.     Left lower leg: No edema.  Neurological:     Mental Status: She is alert.    ED Course/ Medical Decision Making/ A&P    Medications Ordered in ED Medications  potassium chloride SA (KLOR-CON M) CR tablet 40 mEq (40 mEq Oral Given 03/29/23 1038)    Medical Decision Making:    Robin York is a 72 y.o. female who presented to the ED today with chest palpitations.   Complete initial physical exam performed, notably the patient had an irregular heart beat.    Reviewed and confirmed nursing documentation for past medical history, family history, social history.    Initial Assessment:   With the patient's presentation the most likely diagnosis is PACs. Could potentially be due to hypothyroidism given prior history. Other diagnoses were considered including (but not limited to) PVCs, SVTs, afib, PE. These are considered less likely due to history of present illness and physical exam findings.     Initial Plan:  EKG to evaluate for cardiac pathology. Constant heart monitoring to  capture any arrythmias Troponins. Screening labs including CBC and Metabolic panel to evaluate for infectious or metabolic etiology of disease.  CXR to evaluate for structural/infectious intrathoracic pathology.  Objective evaluation as below reviewed with plan for close reassessment  Initial Study Results:   Laboratory  All laboratory results reviewed without evidence of clinically relevant pathology.    Results for orders placed or performed during the hospital encounter of 03/29/23  Basic metabolic panel  Result Value Ref Range   Sodium 137 135 - 145 mmol/L   Potassium 3.4 (L) 3.5 - 5.1 mmol/L   Chloride 101 98 - 111  mmol/L   CO2 27 22 - 32 mmol/L   Glucose, Bld 95 70 - 99 mg/dL   BUN 12 8 - 23 mg/dL   Creatinine, Ser 4.09 0.44 - 1.00 mg/dL   Calcium 9.5 8.9 - 81.1 mg/dL   GFR, Estimated >91 >47 mL/min   Anion gap 9 5 - 15  CBC  Result Value Ref Range   WBC 7.9 4.0 - 10.5 K/uL   RBC 4.38 3.87 - 5.11 MIL/uL   Hemoglobin 13.6 12.0 - 15.0 g/dL   HCT 82.9 56.2 - 13.0 %   MCV 93.6 80.0 - 100.0 fL   MCH 31.1 26.0 - 34.0 pg   MCHC 33.2 30.0 - 36.0 g/dL   RDW 86.5 78.4 - 69.6 %   Platelets 279 150 - 400 K/uL   nRBC 0.0 0.0 - 0.2 %  TSH  Result Value Ref Range   TSH 5.707 (H) 0.350 - 4.500 uIU/mL  Magnesium  Result Value Ref Range   Magnesium 1.8 1.7 - 2.4 mg/dL  Troponin I (High Sensitivity)  Result Value Ref Range   Troponin I (High Sensitivity) 5 <18 ng/L  Troponin I (High Sensitivity)  Result Value Ref Range   Troponin I (High Sensitivity) 5 <18 ng/L   DG Chest 2 View  Result Date: 03/29/2023 CLINICAL DATA:  Palpitations for approximately 1 week. Shortness of breath. EXAM: CHEST - 2 VIEW COMPARISON:  None Available. FINDINGS: Cardiac silhouette and mediastinal contours are within normal limits. Flattening of the diaphragms and mild to moderate hyperinflation. Increased attenuation of the superior pulmonary vasculature compatible with chronic emphysematous changes. No focal airspace opacity to indicate pneumonia. No pleural effusion or pneumothorax. Mild to moderate multilevel degenerative disc changes of the thoracic spine. Surgical clips overlie the left axilla. IMPRESSION: 1. No acute cardiopulmonary process. 2. Chronic emphysematous changes. Electronically Signed   By: Neita Garnet M.D.   On: 03/29/2023 10:14     EKG EKG was reviewed independently. Normal Rate, irregular rhythm. PACs present.   Reassessment and Plan:    Clinical Impression:  1. Premature atrial contractions     The patient had multiple PACs during her admission. She was symptomatic but improved throughout her  admission. Potassium was mildly low, repleated. TSH pending, instructed to call PCP today for follow up. Metoprolol in the meantime.   Discharge   Final Clinical Impression(s) / ED Diagnoses Final diagnoses:  Premature atrial contractions    Rx / DC Orders ED Discharge Orders          Ordered    Ambulatory referral to Cardiology       Comments: If you have not heard from the Cardiology office within the next 72 hours please call [phone].   03/29/23 1321    metoprolol succinate (TOPROL-XL) 25 MG 24 hr tablet  Daily,   Status:  Discontinued  03/29/23 1321    metoprolol succinate (TOPROL-XL) 25 MG 24 hr tablet  Daily        03/29/23 1334              [place], [place], MD 03/29/23 1627    Rolan Bucco, MD 04/06/23 1624

## 2023-05-29 ENCOUNTER — Ambulatory Visit: Payer: Medicare Other | Admitting: Cardiology
